# Patient Record
Sex: Male | Born: 1970 | Race: White | Hispanic: No | Marital: Married | Smoking: Former smoker
Health system: Southern US, Community
[De-identification: ages and names within clinical notes are randomized; demographics above are authoritative.]

## PROBLEM LIST (undated history)

## (undated) DIAGNOSIS — I2510 Atherosclerotic heart disease of native coronary artery without angina pectoris: Secondary | ICD-10-CM

## (undated) HISTORY — PX: CHOLECYSTECTOMY: SHX55

---

## 2016-02-07 DIAGNOSIS — Z87891 Personal history of nicotine dependence: Secondary | ICD-10-CM | POA: Insufficient documentation

## 2016-02-07 DIAGNOSIS — E669 Obesity, unspecified: Secondary | ICD-10-CM | POA: Insufficient documentation

## 2016-02-07 DIAGNOSIS — I2510 Atherosclerotic heart disease of native coronary artery without angina pectoris: Secondary | ICD-10-CM | POA: Insufficient documentation

## 2017-05-02 ENCOUNTER — Encounter: Payer: Self-pay | Admitting: Emergency Medicine

## 2017-05-02 ENCOUNTER — Ambulatory Visit
Admission: EM | Admit: 2017-05-02 | Discharge: 2017-05-02 | Disposition: A | Discharge status: Home / Self Care | Payer: BLUE CROSS/BLUE SHIELD

## 2017-05-02 DIAGNOSIS — L03011 Cellulitis of right finger: Secondary | ICD-10-CM | POA: Diagnosis not present

## 2017-05-02 DIAGNOSIS — L02511 Cutaneous abscess of right hand: Secondary | ICD-10-CM

## 2017-05-02 HISTORY — DX: Atherosclerotic heart disease of native coronary artery without angina pectoris: I25.10

## 2017-05-02 MED ORDER — MUPIROCIN 2 % EX OINT: 1.0000 "application " | TOPICAL_OINTMENT | Freq: Three times a day (TID) | CUTANEOUS | 0 refills | Status: DC

## 2017-05-02 MED ORDER — DOXYCYCLINE HYCLATE 100 MG PO CAPS: 100.0000 mg | ORAL_CAPSULE | Freq: Two times a day (BID) | ORAL | 0 refills | Status: DC

## 2017-05-02 NOTE — ED Triage Notes (Signed)
Patient in today c/o infection in his right pointer finger x 3-4 days.patient has soaked it in salt water w/o relief.

## 2017-05-02 NOTE — ED Provider Notes (Signed)
[facility]    CSN: [id number] Arrival date & time: 05/02/17  0802     History   Chief Complaint Chief Complaint  Patient presents with  . infected finger    HPI Paul Nielsen is a 46 y.o. male.   HPI  [name] who presents with a right dominant index finger paronychia. He states that he was cleaning out his gutters shortly before developing the paronychia. Since that time he has been soaking it and salt water but has not had any success in resolution. He denies any fever or chills.        Past Medical History:  Diagnosis Date  . Coronary artery disease    2014, 1 stent placed    There are no active problems to display for this patient.   Past Surgical History:  Procedure Laterality Date  . CHOLECYSTECTOMY         Home Medications    Prior to Admission medications   Medication Sig Start Date End Date Taking? Authorizing Provider  ASPIRIN 81 PO Take 1 tablet by mouth daily.   Yes [provider]  atorvastatin (LIPITOR) 40 MG tablet Take 40 mg by mouth daily.   Yes [provider]  clopidogrel (PLAVIX) 75 MG tablet Take 75 mg by mouth daily.   Yes [provider]  lisinopril (PRINIVIL,ZESTRIL) 2.5 MG tablet Take 2.5 mg by mouth daily.   Yes [provider]  doxycycline (VIBRAMYCIN) 100 MG capsule Take 1 capsule (100 mg total) by mouth 2 (two) times daily. 05/02/17   Lorin Picket, PA-C  mupirocin ointment (BACTROBAN) 2 % Apply 1 application topically 3 (three) times daily. 05/02/17   Lorin Picket, PA-C    Family History Family History  Problem Relation Age of Onset  . Cancer Mother        breast    Social History Social History  Substance Use Topics  . Smoking status: Former Smoker    Quit date: 05/02/2013  . Smokeless tobacco: Never Used  . Alcohol use Yes     Comment: socially     Allergies   Patient has no known allergies.   Review of Systems Review of Systems    Constitutional: Positive for activity change. Negative for chills, fatigue and fever.  Skin: Positive for color change and wound.  All other systems reviewed and are negative.    Physical Exam Triage Vital Signs ED Triage Vitals  Enc Vitals Group     BP 05/02/17 0819 102/74     Pulse Rate 05/02/17 0819 79     Resp 05/02/17 0819 16     Temp 05/02/17 0819 98.2 F (36.8 C)     Temp Source 05/02/17 0819 Oral     SpO2 05/02/17 0819 100 %     Weight 05/02/17 0818 230 lb (104.3 kg)     Height 05/02/17 0818 6\' 2"  (1.88 m)     Head Circumference --      Peak Flow --      Pain Score 05/02/17 0820 4     Pain Loc --      Pain Edu? --      Excl. in [place]? --    No data found.   Updated Vital Signs BP 102/74 (BP Location: Left Arm)   Pulse 79   Temp 98.2 F (36.8 C) (Oral)   Resp 16   Ht 6\' 2"  (1.88 m)   Wt 230 lb (104.3 kg)   SpO2  100%   BMI 29.53 kg/m   Visual Acuity Right Eye Distance:   Left Eye Distance:   Bilateral Distance:    Right Eye Near:   Left Eye Near:    Bilateral Near:     Physical Exam  Constitutional: He is oriented to person, place, and time. He appears well-developed and well-nourished. No distress.  HENT:  Head: Normocephalic.  Eyes: Pupils are equal, round, and reactive to light. Right eye exhibits no discharge. Left eye exhibits no discharge.  Neck: Normal range of motion.  Musculoskeletal: Normal range of motion. He exhibits edema and tenderness.  Neurological: He is alert and oriented to person, place, and time.  Skin: Skin is warm and dry. He is not diaphoretic.   Examination of he right dominant index finger shows swelling erythema and fluctuance at the base of the nail.  Psychiatric: He has a normal mood and affect. His behavior is normal. Judgment and thought content normal.  Nursing note and vitals reviewed.    UC Treatments / Results  Labs (all labs ordered are listed, but only abnormal results are displayed) Labs Reviewed - No data  to display  EKG  EKG Interpretation None       Radiology No results found.  Procedures .Marland KitchenIncision and Drainage Date/Time: 05/02/2017 9:16 AM Performed by: Lorin Picket Authorized by: Lorin Picket   Consent:    Consent obtained:  Verbal   Consent given by:  Patient   Risks discussed:  Incomplete drainage, pain and infection   Alternatives discussed:  Alternative treatment Location:    Type:  Abscess   Size:  5x3 mm   Location:  Upper extremity   Upper extremity location:  Finger   Finger location:  R index finger Pre-procedure details:    Skin preparation:  Chloraprep Anesthesia (see MAR for exact dosages):    Anesthesia method:  Local infiltration   Local anesthetic:  Lidocaine 1% w/o epi Procedure type:    Complexity:  Simple Procedure details:    Needle aspiration: yes     Needle size:  18 G   Incision types:  Stab incision   Incision depth:  Subcutaneous   Wound management:  Probed and deloculated   Drainage:  Bloody and purulent   Drainage amount:  Scant   Wound treatment:  Wound left open   Packing materials:  None Post-procedure details:    Patient tolerance of procedure:  Tolerated well, no immediate complications Comments:     Patient was instructed in warm compresses to apply 3-4 times daily for 10 minutes at a time. He will dry the area and apply Bactroban ointment. He was also placed on doxycycline twice a day for 5 days. He is leaving Saturday for [place]. I have advised him that if it is worsening or not improving should be seen in emergency room or urgent care in [place]. Removable fingertip splint was applied for protection only.   (including critical care time)  Medications Ordered in UC Medications - No data to display   Initial Impression / Assessment and Plan / UC Course  I have reviewed the triage vital signs and the nursing notes.  Pertinent labs & imaging results that were available during my care of the patient were reviewed by  me and considered in my medical decision making (see chart for details).     Plan: 1. Test/x-ray results and diagnosis reviewed with patient 2. rx as per orders; risks, benefits, potential side effects reviewed with patient 3. Recommend  supportive treatment with washing and applying warm compresses 3 times daily. Refer to procedural comments for further details. 4. F/u prn if symptoms worsen or don't improve   Final Clinical Impressions(s) / UC Diagnoses   Final diagnoses:  Paronychia of right index finger    New Prescriptions Discharge Medication List as of 05/02/2017  9:11 AM    START taking these medications   Details  doxycycline (VIBRAMYCIN) 100 MG capsule Take 1 capsule (100 mg total) by mouth 2 (two) times daily., Starting Thu 05/02/2017, Normal    mupirocin ointment (BACTROBAN) 2 % Apply 1 application topically 3 (three) times daily., Starting Thu 05/02/2017, Normal         Controlled Substance Prescriptions [place] Controlled Substance Registry consulted? Not Applicable   Lorin Picket, PA-C 05/02/17 1188

## 2017-05-02 NOTE — Discharge Instructions (Signed)
Apply warm compresses 3-4 times daily for 10 minutes each time as we discussed ;afterwards dry thoroughly and apply Bactroban ointment. Use the finger splint as necessary to prevent irritation

## 2020-01-29 ENCOUNTER — Encounter: Payer: Self-pay | Admitting: Emergency Medicine

## 2020-01-29 ENCOUNTER — Other Ambulatory Visit: Payer: Self-pay

## 2020-01-29 ENCOUNTER — Ambulatory Visit
Admission: EM | Admit: 2020-01-29 | Discharge: 2020-01-29 | Disposition: A | Discharge status: Home / Self Care | Payer: BC Managed Care – PPO | Attending: Internal Medicine | Admitting: Internal Medicine

## 2020-01-29 DIAGNOSIS — L255 Unspecified contact dermatitis due to plants, except food: Secondary | ICD-10-CM

## 2020-01-29 MED ORDER — HYDROXYZINE HCL 25 MG PO TABS: 25.0000 mg | ORAL_TABLET | Freq: Three times a day (TID) | ORAL | 0 refills | Status: DC | PRN

## 2020-01-29 MED ORDER — METHYLPREDNISOLONE SODIUM SUCC 125 MG IJ SOLR
60.0000 mg | Freq: Once | INTRAMUSCULAR | Status: AC
  Administered 2020-01-29: 60 mg via INTRAMUSCULAR

## 2020-01-29 MED ORDER — PREDNISONE 10 MG (21) PO TBPK: ORAL_TABLET | Freq: Every day | ORAL | 0 refills | Status: DC

## 2020-01-29 NOTE — ED Triage Notes (Signed)
Patient c/o itchy rash on his face, arms and legs for the past 3 days.

## 2020-01-29 NOTE — Discharge Instructions (Addendum)
Return to urgent care if symptoms worsen Continue to apply over-the-counter remedies to the rash

## 2020-01-29 NOTE — ED Provider Notes (Signed)
[facility]    CSN: [id number] Arrival date & time: 01/29/20  4193      History   Chief Complaint Chief Complaint  Patient presents with  . Rash    HPI Paul Nielsen is a 49 y.o. male patient comes to the urgent care with complaints of generalized pruritic rash over the face, arms and legs over the past 3 days.  Patient got into some sumac few days ago and then subsequently he noticed the rash.  Patient denies any fever or chills.  He has applied over-the-counter medications with no improvement.  No irritation in his eyes.  No light sensitivity.  No nausea or vomiting.   HPI  Past Medical History:  Diagnosis Date  . Coronary artery disease    2014, 1 stent placed    There are no problems to display for this patient.   Past Surgical History:  Procedure Laterality Date  . CHOLECYSTECTOMY         Home Medications    Prior to Admission medications   Medication Sig Start Date End Date Taking? Authorizing Provider  ASPIRIN 81 PO Take 1 tablet by mouth daily.   Yes [provider]  atorvastatin (LIPITOR) 40 MG tablet Take 40 mg by mouth daily.   Yes [provider]  hydrOXYzine (ATARAX/VISTARIL) 25 MG tablet Take 1 tablet (25 mg total) by mouth every 8 (eight) hours as needed for itching. 01/29/20   Rollen Selders, Myrene Galas, MD  predniSONE (STERAPRED UNI-PAK 21 TAB) 10 MG (21) TBPK tablet Take by mouth daily. Take 6 tabs by mouth daily  for 2 days, then 5 tabs for 2 days, then 4 tabs for 2 days, then 3 tabs for 2 days, 2 tabs for 2 days, then 1 tab by mouth daily for 2 days 01/29/20   Chase Picket, MD  lisinopril (PRINIVIL,ZESTRIL) 2.5 MG tablet Take 2.5 mg by mouth daily.  01/29/20  [provider]    Family History Family History  Problem Relation Age of Onset  . Cancer Mother        breast    Social History Social History   Tobacco Use  . Smoking status: Former Smoker    Quit date: 05/02/2013    Years since quitting: 6.7  .  Smokeless tobacco: Never Used  Vaping Use  . Vaping Use: Never used  Substance Use Topics  . Alcohol use: Yes    Comment: socially  . Drug use: No     Allergies   Patient has no known allergies.   Review of Systems Review of Systems  Respiratory: Negative.   Cardiovascular: Negative.   Gastrointestinal: Negative.   Musculoskeletal: Negative for arthralgias and myalgias.  Skin: Positive for rash. Negative for color change and wound.  Neurological: Negative.      Physical Exam Triage Vital Signs ED Triage Vitals  Enc Vitals Group     BP 01/29/20 0957 118/78     Pulse Rate 01/29/20 0957 69     Resp 01/29/20 0957 16     Temp 01/29/20 0957 98 F (36.7 C)     Temp Source 01/29/20 0957 Oral     SpO2 01/29/20 0957 100 %     Weight 01/29/20 0955 (!) 245 lb (111.1 kg)     Height 01/29/20 0955 6\' 2"  (1.88 m)     Head Circumference --      Peak Flow --      Pain Score 01/29/20 0955 0  Pain Loc --      Pain Edu? --      Excl. in [place]? --    No data found.  Updated Vital Signs BP 118/78 (BP Location: Right Arm)   Pulse 69   Temp 98 F (36.7 C) (Oral)   Resp 16   Ht 6\' 2"  (1.88 m)   Wt (!) 111.1 kg   SpO2 100%   BMI 31.46 kg/m   Visual Acuity Right Eye Distance:   Left Eye Distance:   Bilateral Distance:    Right Eye Near:   Left Eye Near:    Bilateral Near:     Physical Exam Vitals and nursing note reviewed.  Constitutional:      General: He is not in acute distress.    Appearance: He is not ill-appearing.  Cardiovascular:     Rate and Rhythm: Normal rate and regular rhythm.  Skin:    General: Skin is warm.     Findings: Rash present. No bruising.     Comments: Generalized papular rash over the face upper extremities and legs.  No ulcerations.  No vesicles or blistering.  Rash over the face is more pronounced compared to the extremities.  Neurological:     Mental Status: He is alert.      UC Treatments / Results  Labs (all labs ordered are  listed, but only abnormal results are displayed) Labs Reviewed - No data to display  EKG   Radiology No results found.  Procedures Procedures (including critical care time)  Medications Ordered in UC Medications  methylPREDNISolone sodium succinate (SOLU-MEDROL) 125 mg/2 mL injection 60 mg (has no administration in time range)    Initial Impression / Assessment and Plan / UC Course  I have reviewed the triage vital signs and the nursing notes.  Pertinent labs & imaging results that were available during my care of the patient were reviewed by me and considered in my medical decision making (see chart for details).     1.  Rous dermatitis: Methylprednisolone 60 mg IM x1 Tapering dose of prednisone Hydralazine as needed for itching Return precautions given If patient symptoms worsens he is advised to return to urgent care to be reevaluated. Final Clinical Impressions(s) / UC Diagnoses   Final diagnoses:  Rhus dermatitis     Discharge Instructions     Return to urgent care if symptoms worsen Continue to apply over-the-counter remedies to the rash   ED Prescriptions    Medication Sig Dispense Auth. Provider   predniSONE (STERAPRED UNI-PAK 21 TAB) 10 MG (21) TBPK tablet Take by mouth daily. Take 6 tabs by mouth daily  for 2 days, then 5 tabs for 2 days, then 4 tabs for 2 days, then 3 tabs for 2 days, 2 tabs for 2 days, then 1 tab by mouth daily for 2 days 42 tablet Katianne Barre, Myrene Galas, MD   hydrOXYzine (ATARAX/VISTARIL) 25 MG tablet Take 1 tablet (25 mg total) by mouth every 8 (eight) hours as needed for itching. 30 tablet Ashleigh Luckow, Myrene Galas, MD     PDMP not reviewed this encounter.   Chase Picket, MD 01/29/20 [id number]

## 2020-10-25 DIAGNOSIS — I252 Old myocardial infarction: Secondary | ICD-10-CM | POA: Diagnosis not present

## 2020-10-25 DIAGNOSIS — I25119 Atherosclerotic heart disease of native coronary artery with unspecified angina pectoris: Secondary | ICD-10-CM | POA: Diagnosis not present

## 2020-10-25 DIAGNOSIS — F419 Anxiety disorder, unspecified: Secondary | ICD-10-CM | POA: Diagnosis not present

## 2020-10-25 DIAGNOSIS — Z79899 Other long term (current) drug therapy: Secondary | ICD-10-CM | POA: Diagnosis not present

## 2020-10-25 DIAGNOSIS — I213 ST elevation (STEMI) myocardial infarction of unspecified site: Secondary | ICD-10-CM | POA: Diagnosis not present

## 2020-10-25 DIAGNOSIS — Z87891 Personal history of nicotine dependence: Secondary | ICD-10-CM | POA: Diagnosis not present

## 2020-11-07 DIAGNOSIS — Z20822 Contact with and (suspected) exposure to covid-19: Secondary | ICD-10-CM | POA: Diagnosis not present

## 2020-11-14 ENCOUNTER — Telehealth: Payer: BC Managed Care – PPO | Admitting: Emergency Medicine

## 2020-11-14 DIAGNOSIS — U071 COVID-19: Secondary | ICD-10-CM

## 2020-11-14 NOTE — Progress Notes (Signed)
  Based on what you have shared with me, you need to seek an "in-person" evaluation for respiratory symptoms related to Covid 19.   Please call for an appointment:   Respiratory Clinic at [facility] [address] [place], [place]. Monday, Wednesday, Friday 5:30PM to 7:30PM  If you have severe symptoms of any kind, please seek medical care at an emergency room. Our Emergency Departments are best equipped to handle patients with severe symptoms.    . [facility] Emergency Department [place], [place], [place] [address] [phone]  . [facility] Emergency Department [place], [place], [place] [address] [phone]  . [facility] Emergency Department [place], [place], [place] [address] [phone]  . [facility] Emergency Department [address] [place], [place], [place] [address] [phone]  . [facility] Emergency Department [place], [place], [place] [address] [phone]   If you are having a true medical emergency please call 911.  NOTE: If you entered your credit card information for this eVisit, you will not be charged. You may see a "hold" on your card for the $35 but that hold will drop off and you will not have a charge processed.   Your e-visit answers were reviewed by a board certified advanced clinical practitioner to complete your personal care plan.  Thank you for using e-Visits     Approximately 5 minutes was spent documenting and reviewing patient's chart.

## 2020-11-17 ENCOUNTER — Encounter: Payer: Self-pay | Admitting: Nurse Practitioner

## 2020-11-17 NOTE — Progress Notes (Signed)
error 

## 2020-11-21 ENCOUNTER — Encounter: Payer: Self-pay | Admitting: Nurse Practitioner

## 2020-11-21 ENCOUNTER — Telehealth (INDEPENDENT_AMBULATORY_CARE_PROVIDER_SITE_OTHER): Payer: Self-pay | Admitting: Nurse Practitioner

## 2020-11-21 DIAGNOSIS — Z8616 Personal history of COVID-19: Secondary | ICD-10-CM | POA: Insufficient documentation

## 2020-11-21 DIAGNOSIS — J0100 Acute maxillary sinusitis, unspecified: Secondary | ICD-10-CM

## 2020-11-21 MED ORDER — AZITHROMYCIN 250 MG PO TABS: ORAL_TABLET | ORAL | 0 refills | Status: AC

## 2020-11-21 MED ORDER — PREDNISONE 20 MG PO TABS: 20.0000 mg | ORAL_TABLET | Freq: Every day | ORAL | 0 refills | Status: AC

## 2020-11-21 NOTE — Patient Instructions (Addendum)
History of Covid 19 Sinusits:   Stay well hydrated  Stay active  Deep breathing exercises  May take tylenol or fever or pain  May take mucinex twice daily  Will order azithromycin  Will order prednisone  May start zyrtec     Follow up:  Follow up if needed

## 2020-11-21 NOTE — Progress Notes (Signed)
Virtual Visit via Telephone Note  I connected with Paul Nielsen on 11/21/20 at  8:30 AM EDT by telephone and verified that I am speaking with the correct person using two identifiers.  Location: Patient: home Provider: office   I discussed the limitations, risks, security and privacy concerns of performing an evaluation and management service by telephone and the availability of in person appointments. I also discussed with the patient that there may be a patient responsible charge related to this service. The patient expressed understanding and agreed to proceed.   History of Present Illness:  Patient presents today for post-COVID care clinic visit through televisit.  Patient has a positive for COVID 2 weeks ago.  He states that he is currently in [place].  He is having lingering symptoms of head congestion and slight chest congestion.  He has been taking Mucinex with minimal relief noted.  He does have a cough that is productive at times.  He denies any recent fever. Denies f/c/s, n/v/d, hemoptysis, PND, chest pain or edema.    Observations/Objective:  Vitals with BMI 01/29/2020 05/02/2017  Height 6\' 2"  6\' 2"   Weight 245 lbs 230 lbs  BMI 09.32 35.57  Systolic 322 025  Diastolic 78 74  Pulse 69 79     Assessment and Plan:  History of Covid 19 Sinusits:   Stay well hydrated  Stay active  Deep breathing exercises  May take tylenol or fever or pain  May take mucinex twice daily  Will order azithromycin  Will order prednisone  May start zyrtec     Follow up:  Follow up if needed      I discussed the assessment and treatment plan with the patient. The patient was provided an opportunity to ask questions and all were answered. The patient agreed with the plan and demonstrated an understanding of the instructions.   The patient was advised to call back or seek an in-person evaluation if the symptoms worsen or if the condition fails to improve as  anticipated.  I provided 23 minutes of non-face-to-face time during this encounter.   Fenton Foy, NP

## 2020-12-02 DIAGNOSIS — L0233 Carbuncle of buttock: Secondary | ICD-10-CM | POA: Diagnosis not present

## 2020-12-02 DIAGNOSIS — R6889 Other general symptoms and signs: Secondary | ICD-10-CM | POA: Diagnosis not present

## 2020-12-02 DIAGNOSIS — J392 Other diseases of pharynx: Secondary | ICD-10-CM | POA: Diagnosis not present

## 2021-03-22 DIAGNOSIS — F4323 Adjustment disorder with mixed anxiety and depressed mood: Secondary | ICD-10-CM | POA: Diagnosis not present

## 2021-03-22 DIAGNOSIS — Z630 Problems in relationship with spouse or partner: Secondary | ICD-10-CM | POA: Diagnosis not present

## 2021-04-14 ENCOUNTER — Other Ambulatory Visit: Payer: Self-pay

## 2021-04-14 ENCOUNTER — Ambulatory Visit (INDEPENDENT_AMBULATORY_CARE_PROVIDER_SITE_OTHER): Payer: BC Managed Care – PPO

## 2021-04-14 ENCOUNTER — Ambulatory Visit
Admission: RE | Admit: 2021-04-14 | Discharge: 2021-04-14 | Disposition: A | Discharge status: Home / Self Care | Payer: BC Managed Care – PPO | Source: Ambulatory Visit | Attending: Physician Assistant | Admitting: Physician Assistant

## 2021-04-14 VITALS — BP 138/79 | HR 88 | Temp 98.1°F | Resp 16

## 2021-04-14 DIAGNOSIS — R198 Other specified symptoms and signs involving the digestive system and abdomen: Secondary | ICD-10-CM

## 2021-04-14 DIAGNOSIS — R0602 Shortness of breath: Secondary | ICD-10-CM | POA: Diagnosis not present

## 2021-04-14 DIAGNOSIS — R0789 Other chest pain: Secondary | ICD-10-CM | POA: Diagnosis not present

## 2021-04-14 NOTE — Discharge Instructions (Signed)
-  Chest x-ray does not show any evidence of a hiatal hernia but it is not completely ruled out and I do have high concern for that. -Start taking Prilosec over-the-counter and increase fluid intake. -I have placed a referral to GI for you.  At this point you should talk to a specialist.  They may perform an endoscopy or an ultrasound to be able to diagnose you. -If you have any abdominal pain, fever, weakness, vomiting, black or bloody stools he should go to the emergency department.

## 2021-04-14 NOTE — ED Provider Notes (Signed)
[facility]    CSN: [id number] Arrival date & time: 04/14/21  1735      History   Chief Complaint Chief Complaint  Patient presents with   Appointment    HPI Paul Nielsen is a 50 y.o. male presenting for approximately 4-day history of upper abdominal pressure and discomfort after eating. He also says that whenever he eats he feels that it is difficult to take a deep breath.  Admits to occasional shortness of breath also.  Pain improves when he lies flat and he says he does not have any symptoms when he is not eating.  He denies abdominal pain.  Normal appetite.  Denies constipation, diarrhea and does not report any dark or bloody stools.  Patient reports that the symptoms have occurred in the past a couple months ago when he had COVID-19.  Patient reports recently coming back from [place] and says symptoms started in [place].  He reports not eating well when he was down there.  He also admits to increased stress in his life this year and says he has gained about 25 pounds.  Patient has taken over-the-counter Gas-X medication and a laxative when he was in [place] and said that seemed to help symptoms.  He denies any fevers.  No reported history of GERD and he denies any pain in his chest.  Has never been seen for this issue before.  Surgical history significant for cholecystectomy.  No other complaints.  HPI  Past Medical History:  Diagnosis Date   Coronary artery disease    2014, 1 stent placed    Patient Active Problem List   Diagnosis Date Noted   History of COVID-19 11/21/2020   Acute non-recurrent maxillary sinusitis 11/21/2020    Past Surgical History:  Procedure Laterality Date   CHOLECYSTECTOMY         Home Medications    Prior to Admission medications   Medication Sig Start Date End Date Taking? Authorizing Provider  ASPIRIN 81 PO Take 1 tablet by mouth daily.   Yes [provider]  atorvastatin (LIPITOR) 40 MG tablet Take 40 mg by mouth  daily.   Yes [provider]  hydrOXYzine (ATARAX/VISTARIL) 25 MG tablet Take 1 tablet (25 mg total) by mouth every 8 (eight) hours as needed for itching. 01/29/20   Chase Picket, MD  lisinopril (PRINIVIL,ZESTRIL) 2.5 MG tablet Take 2.5 mg by mouth daily.  01/29/20  [provider]    Family History Family History  Problem Relation Age of Onset   Cancer Mother        breast    Social History Social History   Tobacco Use   Smoking status: Former    Types: Cigarettes    Quit date: 05/02/2013    Years since quitting: 7.9   Smokeless tobacco: Never  Vaping Use   Vaping Use: Never used  Substance Use Topics   Alcohol use: Yes    Comment: socially   Drug use: No     Allergies   Patient has no known allergies.   Review of Systems Review of Systems  Constitutional:  Negative for appetite change, fatigue and fever.  HENT:  Negative for congestion.   Respiratory:  Positive for shortness of breath. Negative for cough.   Cardiovascular:  Negative for chest pain.  Gastrointestinal:  Negative for abdominal pain, blood in stool, constipation, diarrhea, nausea and vomiting.       Upper abdominal pressure  Genitourinary:  Negative for dysuria, frequency and testicular  pain.  Musculoskeletal:  Negative for back pain.  Neurological:  Negative for weakness.    Physical Exam Triage Vital Signs ED Triage Vitals  Enc Vitals Group     BP      Pulse      Resp      Temp      Temp src      SpO2      Weight      Height      Head Circumference      Peak Flow      Pain Score      Pain Loc      Pain Edu?      Excl. in [place]?    No data found.  Updated Vital Signs BP 138/79   Pulse 88   Temp 98.1 F (36.7 C) (Oral)   Resp 16   SpO2 96%      Physical Exam Vitals and nursing note reviewed.  Constitutional:      General: He is not in acute distress.    Appearance: Normal appearance. He is well-developed. He is not ill-appearing.  HENT:     Head:  Normocephalic and atraumatic.  Eyes:     General: No scleral icterus.    Conjunctiva/sclera: Conjunctivae normal.  Cardiovascular:     Rate and Rhythm: Normal rate and regular rhythm.     Heart sounds: Normal heart sounds.  Pulmonary:     Effort: Pulmonary effort is normal. No respiratory distress.     Breath sounds: Normal breath sounds.  Abdominal:     Palpations: Abdomen is soft.     Tenderness: There is no abdominal tenderness.  Musculoskeletal:     Cervical back: Neck supple.  Skin:    General: Skin is warm and dry.  Neurological:     General: No focal deficit present.     Mental Status: He is alert. Mental status is at baseline.     Motor: No weakness.     Coordination: Coordination normal.     Gait: Gait normal.  Psychiatric:        Mood and Affect: Mood normal.        Behavior: Behavior normal.        Thought Content: Thought content normal.     UC Treatments / Results  Labs (all labs ordered are listed, but only abnormal results are displayed) Labs Reviewed - No data to display  EKG   Radiology DG Chest 2 View  Result Date: 04/14/2021 CLINICAL DATA:  Recent COVID, pain shortness of breath, chest and abdominal pressure after eating. EXAM: CHEST - 2 VIEW COMPARISON:  None. FINDINGS: The heart size and mediastinal contours are within normal limits. No focal consolidation. No pleural effusion. No pneumothorax. Thoracic spondylosis. Cholecystectomy clips. IMPRESSION: No active cardiopulmonary disease. Electronically Signed   By: Dahlia Bailiff M.D.   On: 04/14/2021 19:11    Procedures Procedures (including critical care time)  Medications Ordered in UC Medications - No data to display  Initial Impression / Assessment and Plan / UC Course  I have reviewed the triage vital signs and the nursing notes.  Pertinent labs & imaging results that were available during my care of the patient were reviewed by me and considered in my medical decision making (see chart for  details).  50 year old male presenting for upper abdominal pressure when eating and occasional shortness of breath especially when eating.  Symptoms improved and he lies flat.  Does report 25 pound weight gain this year  due to stress.  Exam is within normal limits today and so are vital signs.  Chest x-ray obtained to assess for possible hiatal hernia.  Chest x-ray normal.  Advised patient this cannot 100% rule out a hiatal hernia and I do advise he has further work-up for this so I placed a referral to GI.  In the meantime, advised him to start a PPI and eat smaller meals and do not lay down after eating for couple of hours.  Advised to increase rest and fluids and consider taking MiraLAX if he feels constipated and to make sure he is getting plenty of exercise and dietary fiber.  Weight loss encouraged.  Advise going to ED for fever, abdominal pain, lethargy, etc.  Final Clinical Impressions(s) / UC Diagnoses   Final diagnoses:  High intra-abdominal pressure     Discharge Instructions      -Chest x-ray does not show any evidence of a hiatal hernia but it is not completely ruled out and I do have high concern for that. -Start taking Prilosec over-the-counter and increase fluid intake. -I have placed a referral to GI for you.  At this point you should talk to a specialist.  They may perform an endoscopy or an ultrasound to be able to diagnose you. -If you have any abdominal pain, fever, weakness, vomiting, black or bloody stools he should go to the emergency department.     ED Prescriptions   None    PDMP not reviewed this encounter.   Danton Clap, PA-C 04/14/21 1931

## 2021-04-14 NOTE — ED Triage Notes (Signed)
PT reports he had covid recently and he feels a lot of stomach pressure / shortness of breath.

## 2021-04-18 DIAGNOSIS — R0602 Shortness of breath: Secondary | ICD-10-CM | POA: Diagnosis not present

## 2021-04-18 DIAGNOSIS — R10816 Epigastric abdominal tenderness: Secondary | ICD-10-CM | POA: Diagnosis not present

## 2021-04-18 DIAGNOSIS — Z87891 Personal history of nicotine dependence: Secondary | ICD-10-CM | POA: Diagnosis not present

## 2021-04-18 DIAGNOSIS — R1013 Epigastric pain: Secondary | ICD-10-CM | POA: Diagnosis not present

## 2021-04-18 DIAGNOSIS — I2510 Atherosclerotic heart disease of native coronary artery without angina pectoris: Secondary | ICD-10-CM | POA: Diagnosis not present

## 2021-04-20 DIAGNOSIS — R1013 Epigastric pain: Secondary | ICD-10-CM | POA: Diagnosis not present

## 2021-04-27 DIAGNOSIS — F4323 Adjustment disorder with mixed anxiety and depressed mood: Secondary | ICD-10-CM | POA: Diagnosis not present

## 2021-04-27 DIAGNOSIS — Z630 Problems in relationship with spouse or partner: Secondary | ICD-10-CM | POA: Diagnosis not present

## 2021-05-08 DIAGNOSIS — Z630 Problems in relationship with spouse or partner: Secondary | ICD-10-CM | POA: Diagnosis not present

## 2021-05-08 DIAGNOSIS — F4323 Adjustment disorder with mixed anxiety and depressed mood: Secondary | ICD-10-CM | POA: Diagnosis not present

## 2021-05-18 ENCOUNTER — Other Ambulatory Visit: Payer: Self-pay

## 2021-05-18 ENCOUNTER — Encounter: Payer: Self-pay | Admitting: Gastroenterology

## 2021-05-18 ENCOUNTER — Ambulatory Visit: Payer: BC Managed Care – PPO | Admitting: Gastroenterology

## 2021-05-18 VITALS — BP 125/82 | HR 81 | Temp 99.1°F | Ht 70.0 in | Wt 265.0 lb

## 2021-05-18 DIAGNOSIS — R1010 Upper abdominal pain, unspecified: Secondary | ICD-10-CM | POA: Diagnosis not present

## 2021-05-18 NOTE — Progress Notes (Signed)
Paul Darby, MD [address]  [place]  [place], [place] [address]  Main: [phone]  Fax: [phone]    Gastroenterology Consultation  Referring Provider:     Gretta Cool Primary Care Physician:  Patient, No Pcp Per (Inactive) Primary Gastroenterologist:  Dr. Cephas Nielsen Reason for Consultation:     Upper abdominal discomfort        HPI:   Paul Nielsen is a 50 y.o. male referred by Dr. Patient, No Pcp Per (Inactive)  for consultation & management of upper abdominal discomfort.  Patient went to [facility] on 10/18 secondary to 1 week history of epigastric discomfort that started after 5 days of arrival from [place].  Patient went on vacation to [place].  He describes the discomfort as pressure all across his upper abdomen, he feels short of breath because of upper abdominal pressure and sometimes has to catch his breath.  Patient denies chest pain associated with this discomfort.  Patient denies nausea, vomiting.  Patient reports that he is not able to finish his meal because of the discomfort.  He denies abdominal bloating.  He denies melena, change in bowel habits.  He reports having regular bowel movements.  He is a Biomedical scientist and experiments different cuisines.  He denies any weight loss.  He denies heartburn, difficulty swallowing.  He underwent chest x-ray and twelve-lead EKG which were normal.  Labs were unrevealing including CBC, CMP, lipase, troponins.  Patient is expecting to find answers from today's visit.  He is frustrated that he already spent $2000 despite having insurance.  He had history of MI s/p PCI with stent placement in 2014.  Ex-smoker.  Does not drink alcohol regularly  NSAIDs: None  Antiplts/Anticoagulants/Anti thrombotics: None  GI Procedures: None Cologuard negative Patient denies family history of GI malignancy Cholecystectomy at least 4 to 5 years ago  Past Medical History:  Diagnosis Date   Coronary artery disease    2014, 1 stent  placed    Past Surgical History:  Procedure Laterality Date   CHOLECYSTECTOMY      Current Outpatient Medications:    ASPIRIN 81 PO, Take 1 tablet by mouth daily., Disp: , Rfl:    atorvastatin (LIPITOR) 40 MG tablet, Take 40 mg by mouth daily., Disp: , Rfl:    hydrOXYzine (ATARAX/VISTARIL) 25 MG tablet, Take 1 tablet (25 mg total) by mouth every 8 (eight) hours as needed for itching., Disp: 30 tablet, Rfl: 0  Family History  Problem Relation Age of Onset   Cancer Mother        breast     Social History   Tobacco Use   Smoking status: Former    Types: Cigarettes    Quit date: 05/02/2013    Years since quitting: 8.0   Smokeless tobacco: Never  Vaping Use   Vaping Use: Never used  Substance Use Topics   Alcohol use: Yes    Comment: socially   Drug use: No    Allergies as of 05/18/2021   (No Known Allergies)    Review of Systems:    All systems reviewed and negative except where noted in HPI.   Physical Exam:  BP 125/82 (BP Location: Right Arm, Patient Position: Sitting, Cuff Size: Large)   Pulse 81   Temp 99.1 F (37.3 C) (Temporal)   Ht 5\' 10"  (1.778 m)   Wt 265 lb (120.2 kg)   BMI 38.02 kg/m  No LMP for male patient.  General:  Alert,  Well-developed, well-nourished, pleasant and cooperative in NAD Head:  Normocephalic and atraumatic. Eyes:  Sclera clear, no icterus.   Conjunctiva pink. Ears:  Normal auditory acuity. Nose:  No deformity, discharge, or lesions. Mouth:  No deformity or lesions,oropharynx pink & moist. Neck:  Supple; no masses or thyromegaly. Lungs:  Respirations even and unlabored.  Clear throughout to auscultation.   No wheezes, crackles, or rhonchi. No acute distress. Heart:  Regular rate and rhythm; no murmurs, clicks, rubs, or gallops. Abdomen:  Normal bowel sounds. Soft, non-tender and non-distended without masses, hepatosplenomegaly or hernias noted.  No guarding or rebound tenderness.   Rectal: Not performed Msk:  Symmetrical  without gross deformities. Good, equal movement & strength bilaterally. Pulses:  Normal pulses noted. Extremities:  No clubbing or edema.  No cyanosis. Neurologic:  Alert and oriented x3;  grossly normal neurologically. Skin:  Intact without significant lesions or rashes. No jaundice. Psych:  Alert and cooperative. Normal mood and affect.  Imaging Studies: None  Assessment and Plan:   Paul Nielsen is a 50 y.o. Caucasian male with BMI 38, coronary artery disease, MI with stent placement in 2014 is seen in consultation for more than 1 month history of upper abdominal discomfort, feeling of pressure associated with shortness of breath/difficulty breathing and ?  Early satiety  Recommend H. pylori breath test if negative, recommend CT angio chest PE protocol as well as CT abdomen and pelvis with contrast If imaging is unrevealing, recommend upper endoscopy and colonoscopy for further evaluation   Follow up after the above work-up   Paul Darby, MD

## 2021-05-19 ENCOUNTER — Encounter: Payer: Self-pay | Admitting: Gastroenterology

## 2021-05-19 LAB — H. PYLORI BREATH TEST: H pylori Breath Test: NEGATIVE

## 2021-05-22 ENCOUNTER — Other Ambulatory Visit: Payer: Self-pay

## 2021-05-22 DIAGNOSIS — R1010 Upper abdominal pain, unspecified: Secondary | ICD-10-CM

## 2021-05-22 NOTE — Progress Notes (Signed)
Set up ct scan of abdomin and pelvis with contrast  urgently for dec 5 at 3:15 with a 3:30 appointment npo 4 hours prior to scan and  he will need to go by and pick up contrast    And scheduled ct antigo chest pe protocol for same day right after each other  npo by mouth 4 hours prior to firtst ct scan and called patient he understands and is ok with time and day and prep and  picking up contrast

## 2021-06-05 ENCOUNTER — Ambulatory Visit: Payer: BC Managed Care – PPO

## 2021-06-06 ENCOUNTER — Telehealth: Payer: Self-pay

## 2021-06-06 NOTE — Telephone Encounter (Signed)
Patient switching care his insurance called [place] to let us know  they left message on answering machine

## 2021-06-07 ENCOUNTER — Telehealth: Payer: Self-pay

## 2021-06-07 NOTE — Telephone Encounter (Signed)
Inbound call from [employer] stating that pt would like his CT done at [facility] for a lower cost. Optician, dispensing information [phone] Option 2 then 3. Ref# [id number]. [facility] fax # [phone]. Thank you.

## 2021-06-07 NOTE — Telephone Encounter (Signed)
Called patient let him know we faxed information to [facility] for him he said he had them put a rush on when its finished to get dr Marius Ditch the results

## 2021-06-08 ENCOUNTER — Telehealth: Payer: Self-pay | Admitting: Gastroenterology

## 2021-06-08 ENCOUNTER — Telehealth: Payer: Self-pay

## 2021-06-08 NOTE — Telephone Encounter (Signed)
Inbound call from [facility] needing a doctor's signature. Pt has appt tomorrow. Best contact is [place] and phone number is [phone]

## 2021-06-08 NOTE — Telephone Encounter (Signed)
Resent fax with dr signature to [facility] radi[facility] as requested

## 2021-06-09 DIAGNOSIS — R0602 Shortness of breath: Secondary | ICD-10-CM | POA: Diagnosis not present

## 2021-06-09 DIAGNOSIS — R1010 Upper abdominal pain, unspecified: Secondary | ICD-10-CM | POA: Diagnosis not present

## 2021-06-12 ENCOUNTER — Encounter: Payer: Self-pay | Admitting: Gastroenterology

## 2021-06-13 ENCOUNTER — Encounter: Payer: Self-pay | Admitting: Gastroenterology

## 2021-06-14 ENCOUNTER — Other Ambulatory Visit: Payer: Self-pay | Admitting: Gastroenterology

## 2021-06-14 ENCOUNTER — Other Ambulatory Visit: Payer: Self-pay

## 2021-06-14 ENCOUNTER — Telehealth: Payer: Self-pay

## 2021-06-14 DIAGNOSIS — Z630 Problems in relationship with spouse or partner: Secondary | ICD-10-CM | POA: Diagnosis not present

## 2021-06-14 DIAGNOSIS — Z1211 Encounter for screening for malignant neoplasm of colon: Secondary | ICD-10-CM

## 2021-06-14 DIAGNOSIS — F4323 Adjustment disorder with mixed anxiety and depressed mood: Secondary | ICD-10-CM | POA: Diagnosis not present

## 2021-06-14 DIAGNOSIS — R1013 Epigastric pain: Secondary | ICD-10-CM

## 2021-06-14 DIAGNOSIS — G8929 Other chronic pain: Secondary | ICD-10-CM

## 2021-06-14 MED ORDER — SUTAB 1479-225-188 MG PO TABS: 12.0000 | ORAL_TABLET | Freq: Once | ORAL | 0 refills | Status: DC

## 2021-06-14 NOTE — Telephone Encounter (Signed)
Called patient he agreed to do colonoscopy and egd so I set them both up and sent in prep medicine sent refferal

## 2021-06-15 ENCOUNTER — Other Ambulatory Visit: Payer: Self-pay

## 2021-06-15 ENCOUNTER — Telehealth: Payer: Self-pay

## 2021-06-15 MED ORDER — PEG 3350-KCL-NA BICARB-NACL 420 G PO SOLR: 4000.0000 mL | Freq: Once | ORAL | 0 refills | Status: AC

## 2021-06-15 NOTE — Telephone Encounter (Signed)
Sent in alternative prep pharmacy rejected the sutab

## 2021-07-27 ENCOUNTER — Telehealth: Payer: Self-pay

## 2021-07-27 ENCOUNTER — Encounter: Payer: Self-pay | Admitting: Gastroenterology

## 2021-07-27 MED ORDER — PEG 3350-KCL-NA BICARB-NACL 420 G PO SOLR: 4000.0000 mL | Freq: Once | ORAL | 0 refills | Status: AC

## 2021-07-27 NOTE — Telephone Encounter (Signed)
Patient called in requesting paperwork and new prescription to be sent in    done

## 2021-07-27 NOTE — Telephone Encounter (Signed)
Error ment to open phone note

## 2021-07-31 ENCOUNTER — Encounter
Admission: RE | Disposition: A | Discharge status: Home / Self Care | Payer: Self-pay | Source: Home / Self Care | Attending: Gastroenterology

## 2021-07-31 ENCOUNTER — Ambulatory Visit
Admission: RE | Admit: 2021-07-31 | Discharge: 2021-07-31 | Disposition: A | Discharge status: Home / Self Care | Payer: BC Managed Care – PPO | Attending: Gastroenterology | Admitting: Gastroenterology

## 2021-07-31 ENCOUNTER — Ambulatory Visit: Payer: BC Managed Care – PPO | Admitting: Anesthesiology

## 2021-07-31 ENCOUNTER — Encounter: Payer: Self-pay | Admitting: Gastroenterology

## 2021-07-31 DIAGNOSIS — G8929 Other chronic pain: Secondary | ICD-10-CM | POA: Diagnosis not present

## 2021-07-31 DIAGNOSIS — I2510 Atherosclerotic heart disease of native coronary artery without angina pectoris: Secondary | ICD-10-CM | POA: Insufficient documentation

## 2021-07-31 DIAGNOSIS — R1013 Epigastric pain: Secondary | ICD-10-CM

## 2021-07-31 DIAGNOSIS — D122 Benign neoplasm of ascending colon: Secondary | ICD-10-CM | POA: Insufficient documentation

## 2021-07-31 DIAGNOSIS — K635 Polyp of colon: Secondary | ICD-10-CM

## 2021-07-31 DIAGNOSIS — Z87891 Personal history of nicotine dependence: Secondary | ICD-10-CM | POA: Diagnosis not present

## 2021-07-31 DIAGNOSIS — I10 Essential (primary) hypertension: Secondary | ICD-10-CM | POA: Diagnosis not present

## 2021-07-31 DIAGNOSIS — K259 Gastric ulcer, unspecified as acute or chronic, without hemorrhage or perforation: Secondary | ICD-10-CM | POA: Diagnosis not present

## 2021-07-31 DIAGNOSIS — Z1211 Encounter for screening for malignant neoplasm of colon: Secondary | ICD-10-CM | POA: Insufficient documentation

## 2021-07-31 DIAGNOSIS — R1010 Upper abdominal pain, unspecified: Secondary | ICD-10-CM | POA: Diagnosis not present

## 2021-07-31 DIAGNOSIS — D125 Benign neoplasm of sigmoid colon: Secondary | ICD-10-CM | POA: Insufficient documentation

## 2021-07-31 DIAGNOSIS — K3189 Other diseases of stomach and duodenum: Secondary | ICD-10-CM | POA: Insufficient documentation

## 2021-07-31 DIAGNOSIS — K621 Rectal polyp: Secondary | ICD-10-CM | POA: Insufficient documentation

## 2021-07-31 HISTORY — PX: COLONOSCOPY WITH PROPOFOL: SHX5780

## 2021-07-31 HISTORY — PX: ESOPHAGOGASTRODUODENOSCOPY (EGD) WITH PROPOFOL: SHX5813

## 2021-07-31 SURGERY — COLONOSCOPY WITH PROPOFOL
Anesthesia: General

## 2021-07-31 MED ORDER — LIDOCAINE HCL (CARDIAC) PF 100 MG/5ML IV SOSY
PREFILLED_SYRINGE | INTRAVENOUS | Status: DC | PRN
  Administered 2021-07-31: 100 mg via INTRAVENOUS

## 2021-07-31 MED ORDER — DEXMEDETOMIDINE HCL IN NACL 200 MCG/50ML IV SOLN
INTRAVENOUS | Status: DC | PRN
Start: 2021-07-31 — End: 2021-07-31
  Administered 2021-07-31: 12 ug via INTRAVENOUS

## 2021-07-31 MED ORDER — SODIUM CHLORIDE 0.9 % IV SOLN: INTRAVENOUS | Status: DC

## 2021-07-31 MED ORDER — PROPOFOL 500 MG/50ML IV EMUL
INTRAVENOUS | Status: AC
  Filled 2021-07-31: qty 50

## 2021-07-31 MED ORDER — PROPOFOL 10 MG/ML IV BOLUS
INTRAVENOUS | Status: DC | PRN
  Administered 2021-07-31: 100 mg via INTRAVENOUS

## 2021-07-31 MED ORDER — GLYCOPYRROLATE 0.2 MG/ML IJ SOLN
INTRAMUSCULAR | Status: DC | PRN
  Administered 2021-07-31: .2 mg via INTRAVENOUS

## 2021-07-31 MED ORDER — PROPOFOL 500 MG/50ML IV EMUL
INTRAVENOUS | Status: DC | PRN
  Administered 2021-07-31: 150 ug/kg/min via INTRAVENOUS

## 2021-07-31 NOTE — Op Note (Signed)
[facility] Patient Name: Paul Nielsen Procedure Date: 07/31/2021 7:20 AM MRN: [id number] Account #: [id number] Date of Birth: 1970-12-05 Admit Type: Outpatient Age: 51 Room: [facility] ENDO ROOM 1 Gender: Male Note Status: Finalized Instrument Name: Upper Endoscope [id number] Procedure:             Upper GI endoscopy Indications:           Upper abdominal pain Providers:             Lin Landsman MD, MD Medicines:             General Anesthesia Complications:         No immediate complications. Estimated blood loss: None. Procedure:             Pre-Anesthesia Assessment:                        - Prior to the procedure, a History and Physical was                         performed, and patient medications and allergies were                         reviewed. The patient is competent. The risks and                         benefits of the procedure and the sedation options and                         risks were discussed with the patient. All questions                         were answered and informed consent was obtained.                         Patient identification and proposed procedure were                         verified by the physician, the nurse, the                         anesthesiologist, the anesthetist and the technician                         in the pre-procedure area in the procedure room in the                         endoscopy suite. Mental Status Examination: alert and                         oriented. Airway Examination: normal oropharyngeal                         airway and neck mobility. Respiratory Examination:                         clear to auscultation. CV Examination: normal.                         Prophylactic Antibiotics:  The patient does not require                         prophylactic antibiotics. Prior Anticoagulants: The                         patient has taken no previous anticoagulant or                          antiplatelet agents. ASA Grade Assessment: II - A                         patient with mild systemic disease. After reviewing                         the risks and benefits, the patient was deemed in                         satisfactory condition to undergo the procedure. The                         anesthesia plan was to use general anesthesia.                         Immediately prior to administration of medications,                         the patient was re-assessed for adequacy to receive                         sedatives. The heart rate, respiratory rate, oxygen                         saturations, blood pressure, adequacy of pulmonary                         ventilation, and response to care were monitored                         throughout the procedure. The physical status of the                         patient was re-assessed after the procedure.                        After obtaining informed consent, the endoscope was                         passed under direct vision. Throughout the procedure,                         the patient's blood pressure, pulse, and oxygen                         saturations were monitored continuously. The Endoscope                         was introduced through the mouth, and advanced to the  second part of duodenum. The upper GI endoscopy was                         accomplished without difficulty. The patient tolerated                         the procedure well. Findings:      The duodenal bulb and second portion of the duodenum were normal.      A single localized 5 mm erosion with no bleeding and no stigmata of       recent bleeding was found in the gastric antrum. Biopsies were taken       with a cold forceps for Helicobacter pylori testing.      The gastric body and incisura were normal. Biopsies were taken with a       cold forceps for Helicobacter pylori testing.      The cardia and gastric fundus were normal on  retroflexion.      Esophagogastric landmarks were identified: the gastroesophageal junction       was found at 40 cm from the incisors.      The gastroesophageal junction and examined esophagus were normal. Impression:            - Normal duodenal bulb and second portion of the                         duodenum.                        - Erosive gastropathy with no bleeding and no stigmata                         of recent bleeding. Biopsied.                        - Normal gastric body and incisura. Biopsied.                        - Esophagogastric landmarks identified.                        - Normal gastroesophageal junction and esophagus. Recommendation:        - Await pathology results.                        - Proceed with colonoscopy as scheduled                        See colonoscopy report Procedure Code(s):     --- Professional ---                        [phone], Esophagogastroduodenoscopy, flexible,                         transoral; with biopsy, single or multiple Diagnosis Code(s):     --- Professional ---                        K31.89, Other diseases of stomach and duodenum  R10.10, Upper abdominal pain, unspecified CPT copyright 2019 American Medical Association. All rights reserved. The codes documented in this report are preliminary and upon coder review may  be revised to meet current compliance requirements. Dr. Ulyess Mort Lin Landsman MD, MD 07/31/2021 7:56:33 AM This report has been signed electronically. Number of Addenda: 0 Note Initiated On: 07/31/2021 7:20 AM Estimated Blood Loss:  Estimated blood loss: none.      [facility]

## 2021-07-31 NOTE — Anesthesia Postprocedure Evaluation (Signed)
Anesthesia Post Note  Patient: Barre Aydelott  Procedure(s) Performed: COLONOSCOPY WITH PROPOFOL ESOPHAGOGASTRODUODENOSCOPY (EGD) WITH PROPOFOL  Patient location during evaluation: Endoscopy Anesthesia Type: General Level of consciousness: awake and alert Pain management: pain level controlled Vital Signs Assessment: post-procedure vital signs reviewed and stable Respiratory status: spontaneous breathing, nonlabored ventilation, respiratory function stable and patient connected to nasal cannula oxygen Cardiovascular status: blood pressure returned to baseline and stable Postop Assessment: no apparent nausea or vomiting Anesthetic complications: no   No notable events documented.   Last Vitals:  Vitals:   07/31/21 0839 07/31/21 0849  BP: (!) 100/56 (!) 93/59  Pulse: 62 61  Resp: 13 12  Temp:    SpO2: 97% 97%    Last Pain:  Vitals:   07/31/21 0849  TempSrc:   PainSc: 0-No pain                 Arita Miss

## 2021-07-31 NOTE — Anesthesia Preprocedure Evaluation (Signed)
Anesthesia Evaluation  Patient identified by MRN, date of birth, ID band Patient awake    Reviewed: Allergy & Precautions, NPO status , Patient's Chart, lab work & pertinent test results  History of Anesthesia Complications Negative for: history of anesthetic complications  Airway Mallampati: II  TM Distance: >3 FB Neck ROM: Full    Dental no notable dental hx. (+) Teeth Intact   Pulmonary neg pulmonary ROS, neg sleep apnea, neg COPD, Patient abstained from smoking.Not current smoker, former smoker,    Pulmonary exam normal breath sounds clear to auscultation       Cardiovascular Exercise Tolerance: Good METS(-) hypertension+ CAD and + Cardiac Stents  (-) Past MI (-) dysrhythmias  Rhythm:Regular Rate:Normal - Systolic murmurs Good exercise [place]   Neuro/Psych negative neurological ROS  negative psych ROS   GI/Hepatic neg GERD  ,(+)     (-) substance abuse  ,   Endo/Other  neg diabetes  Renal/GU negative Renal ROS     Musculoskeletal   Abdominal   Peds  Hematology   Anesthesia Other Findings Past Medical History: No date: Coronary artery disease     Comment:  2014, 1 stent placed  Reproductive/Obstetrics                             Anesthesia Physical Anesthesia Plan  ASA: 2  Anesthesia Plan: General   Post-op Pain Management: Minimal or no pain anticipated   Induction: Intravenous  PONV Risk Score and Plan: 2 and Propofol infusion, TIVA and Ondansetron  Airway Management Planned: Nasal Cannula  Additional Equipment: None  Intra-op Plan:   Post-operative Plan:   Informed Consent: I have reviewed the patients History and Physical, chart, labs and discussed the procedure including the risks, benefits and alternatives for the proposed anesthesia with the patient or authorized representative who has indicated his/her understanding and acceptance.     Dental advisory  given  Plan Discussed with: CRNA and Surgeon  Anesthesia Plan Comments: (Discussed risks of anesthesia with patient, including possibility of difficulty with spontaneous ventilation under anesthesia necessitating airway intervention, PONV, and rare risks such as cardiac or respiratory or neurological events, and allergic reactions. Discussed the role of CRNA in patient's perioperative care. Patient understands.)        Anesthesia Quick Evaluation

## 2021-07-31 NOTE — Transfer of Care (Addendum)
Immediate Anesthesia Transfer of Care Note  Patient: Paul Nielsen  Procedure(s) Performed: COLONOSCOPY WITH PROPOFOL ESOPHAGOGASTRODUODENOSCOPY (EGD) WITH PROPOFOL  Patient Location: PACU  Anesthesia Type:General  Level of Consciousness: drowsy  Airway & Oxygen Therapy: Patient Spontanous Breathing  Post-op Assessment: Report given to RN  Post vital signs: stable  Last Vitals:  Vitals Value Taken Time  BP    Temp    Pulse    Resp    SpO2      Last Pain:  Vitals:   07/31/21 0654  TempSrc: Temporal         Complications: No notable events documented.

## 2021-07-31 NOTE — Op Note (Signed)
[facility] Patient Name: Paul Nielsen Procedure Date: 07/31/2021 7:06 AM MRN: [id number] Account #: [id number] Date of Birth: [birth year] Admit Type: Inpatient Age: 51 Room: [facility] ENDO ROOM 1 Gender: Male Note Status: Finalized Instrument Name: Jasper Riling [id number] Procedure:             Colonoscopy Indications:           Screening for colorectal malignant neoplasm, This is                         the patient's first colonoscopy Providers:             Lin Landsman MD, MD Referring MD:          no pcp Medicines:             General Anesthesia Complications:         No immediate complications. Estimated blood loss: None. Procedure:             Pre-Anesthesia Assessment:                        - Prior to the procedure, a History and Physical was                         performed, and patient medications and allergies were                         reviewed. The patient is competent. The risks and                         benefits of the procedure and the sedation options and                         risks were discussed with the patient. All questions                         were answered and informed consent was obtained.                         Patient identification and proposed procedure were                         verified by the physician, the nurse, the                         anesthesiologist, the anesthetist and the technician                         in the pre-procedure area in the procedure room in the                         endoscopy suite. Mental Status Examination: alert and                         oriented. Airway Examination: normal oropharyngeal                         airway and neck mobility. Respiratory Examination:  clear to auscultation. CV Examination: normal.                         Prophylactic Antibiotics: The patient does not require                         prophylactic antibiotics. Prior  Anticoagulants: The                         patient has taken no previous anticoagulant or                         antiplatelet agents. ASA Grade Assessment: II - A                         patient with mild systemic disease. After reviewing                         the risks and benefits, the patient was deemed in                         satisfactory condition to undergo the procedure. The                         anesthesia plan was to use general anesthesia.                         Immediately prior to administration of medications,                         the patient was re-assessed for adequacy to receive                         sedatives. The heart rate, respiratory rate, oxygen                         saturations, blood pressure, adequacy of pulmonary                         ventilation, and response to care were monitored                         throughout the procedure. The physical status of the                         patient was re-assessed after the procedure.                        After obtaining informed consent, the colonoscope was                         passed under direct vision. Throughout the procedure,                         the patient's blood pressure, pulse, and oxygen                         saturations were monitored continuously. The  Colonoscope was introduced through the anus and                         advanced to the the cecum, identified by appendiceal                         orifice and ileocecal valve. The colonoscopy was                         performed without difficulty. The patient tolerated                         the procedure well. The quality of the bowel                         preparation was evaluated using the BBPS [facility] Bowel                         Preparation Scale) with scores of: Right Colon = 3,                         Transverse Colon = 3 and Left Colon = 3 (entire mucosa                         seen well with no  residual staining, small fragments                         of stool or opaque liquid). The total BBPS score                         equals 9. Findings:      The perianal and digital rectal examinations were normal. Pertinent       negatives include normal sphincter tone and no palpable rectal lesions.      Three sessile polyps were found in the rectum, sigmoid colon and       ascending colon. The polyps were 4 to 5 mm in size. These polyps were       removed with a cold snare. Resection and retrieval were complete.       Estimated blood loss: none.      The retroflexed view of the distal rectum and anal verge was normal and       showed no anal or rectal abnormalities.      The exam was otherwise without abnormality. Impression:            - Three 4 to 5 mm polyps in the rectum, in the sigmoid                         colon and in the ascending colon, removed with a cold                         snare. Resected and retrieved.                        - The distal rectum and anal verge are normal on  retroflexion view.                        - The examination was otherwise normal. Recommendation:        - Discharge patient to home (with escort).                        - Resume previous diet today.                        - Continue present medications.                        - Await pathology results.                        - Repeat colonoscopy in 5 to 7 years for surveillance                         based on pathology results. Procedure Code(s):     --- Professional ---                        [phone], Colonoscopy, flexible; with removal of                         tumor(s), polyp(s), or other lesion(s) by snare                         technique Diagnosis Code(s):     --- Professional ---                        Z12.11, Encounter for screening for malignant neoplasm                         of colon                        K62.1, Rectal polyp                        K63.5,  Polyp of colon CPT copyright 2019 American Medical Association. All rights reserved. The codes documented in this report are preliminary and upon coder review may  be revised to meet current compliance requirements. Dr. Ulyess Mort Lin Landsman MD, MD 07/31/2021 8:17:00 AM This report has been signed electronically. Number of Addenda: 0 Note Initiated On: 07/31/2021 7:06 AM Scope Withdrawal Time: 0 hours 12 minutes 55 seconds  Total Procedure Duration: 0 hours 16 minutes 35 seconds  Estimated Blood Loss:  Estimated blood loss: none.      [facility]

## 2021-07-31 NOTE — H&P (Signed)
°  Cephas Darby, MD [address]  [place], [place] [address]  Main: [phone]  Fax: [phone] Pager: [phone]  Primary Care Physician:  Patient, No Pcp Per (Inactive) Primary Gastroenterologist:  Dr. Cephas Darby  Pre-Procedure History & Physical: HPI:  Paul Nielsen is a 51 y.o. male is here for an endoscopy and colonoscopy.   Past Medical History:  Diagnosis Date   Coronary artery disease    2014, 1 stent placed    Past Surgical History:  Procedure Laterality Date   CHOLECYSTECTOMY      Prior to Admission medications   Medication Sig Start Date End Date Taking? Authorizing Provider  ASPIRIN 81 PO Take 1 tablet by mouth daily.   Yes [provider]  atorvastatin (LIPITOR) 40 MG tablet Take 40 mg by mouth daily.   Yes [provider]  lisinopril (PRINIVIL,ZESTRIL) 2.5 MG tablet Take 2.5 mg by mouth daily.  01/29/20  [provider]    Allergies as of 06/14/2021   (No Known Allergies)    Family History  Problem Relation Age of Onset   Cancer Mother        breast    Social History   Socioeconomic History   Marital status: Married    Spouse name: Not on file   Number of children: Not on file   Years of education: Not on file   Highest education level: Not on file  Occupational History   Not on file  Tobacco Use   Smoking status: Former    Types: Cigarettes    Quit date: 05/02/2013    Years since quitting: 8.2   Smokeless tobacco: Never  Vaping Use   Vaping Use: Never used  Substance and Sexual Activity   Alcohol use: Yes    Comment: socially   Drug use: No   Sexual activity: Not on file  Other Topics Concern   Not on file  Social History Narrative   Not on file   Social Determinants of Health   Financial Resource Strain: Not on file  Food Insecurity: Not on file  Transportation Needs: Not on file  Physical Activity: Not on file  Stress: Not on file  Social Connections: Not on file   Intimate Partner Violence: Not on file    Review of Systems: See HPI, otherwise negative ROS  Physical Exam: BP 106/73    Pulse 70    Temp (!) 96.3 F (35.7 C) (Temporal)    Resp 16    Ht 6\' 2"  (1.88 m)    Wt 117 kg    SpO2 98%    BMI 33.13 kg/m  General:   Alert,  pleasant and cooperative in NAD Head:  Normocephalic and atraumatic. Neck:  Supple; no masses or thyromegaly. Lungs:  Clear throughout to auscultation.    Heart:  Regular rate and rhythm. Abdomen:  Soft, nontender and nondistended. Normal bowel sounds, without guarding, and without rebound.   Neurologic:  Alert and  oriented x4;  grossly normal neurologically.  Impression/Plan: Cyril Woodmansee is here for an endoscopy and colonoscopy to be performed for upper abdominal discomfort, colon cancer screening  Risks, benefits, limitations, and alternatives regarding  endoscopy and colonoscopy have been reviewed with the patient.  Questions have been answered.  All parties agreeable.   Sherri Sear, MD  07/31/2021, 7:44 AM

## 2021-08-01 ENCOUNTER — Encounter: Payer: Self-pay | Admitting: Gastroenterology

## 2021-08-01 LAB — SURGICAL PATHOLOGY

## 2021-08-04 DIAGNOSIS — R0602 Shortness of breath: Secondary | ICD-10-CM | POA: Diagnosis not present

## 2021-08-04 DIAGNOSIS — Z79899 Other long term (current) drug therapy: Secondary | ICD-10-CM | POA: Diagnosis not present

## 2021-08-04 DIAGNOSIS — Z87891 Personal history of nicotine dependence: Secondary | ICD-10-CM | POA: Diagnosis not present

## 2021-08-04 DIAGNOSIS — Z23 Encounter for immunization: Secondary | ICD-10-CM | POA: Diagnosis not present

## 2021-08-24 DIAGNOSIS — R0602 Shortness of breath: Secondary | ICD-10-CM | POA: Diagnosis not present

## 2021-09-07 DIAGNOSIS — R0609 Other forms of dyspnea: Secondary | ICD-10-CM | POA: Diagnosis not present

## 2021-09-07 DIAGNOSIS — R1013 Epigastric pain: Secondary | ICD-10-CM | POA: Diagnosis not present

## 2021-09-07 DIAGNOSIS — I25119 Atherosclerotic heart disease of native coronary artery with unspecified angina pectoris: Secondary | ICD-10-CM | POA: Diagnosis not present

## 2021-09-07 DIAGNOSIS — E782 Mixed hyperlipidemia: Secondary | ICD-10-CM | POA: Diagnosis not present

## 2021-09-25 DIAGNOSIS — I25119 Atherosclerotic heart disease of native coronary artery with unspecified angina pectoris: Secondary | ICD-10-CM | POA: Diagnosis not present

## 2021-09-25 DIAGNOSIS — R1013 Epigastric pain: Secondary | ICD-10-CM | POA: Diagnosis not present

## 2021-09-25 DIAGNOSIS — R0609 Other forms of dyspnea: Secondary | ICD-10-CM | POA: Diagnosis not present

## 2022-01-01 ENCOUNTER — Ambulatory Visit
Admission: EM | Admit: 2022-01-01 | Discharge: 2022-01-01 | Disposition: A | Discharge status: Home / Self Care | Payer: BC Managed Care – PPO | Attending: Emergency Medicine | Admitting: Emergency Medicine

## 2022-01-01 DIAGNOSIS — L259 Unspecified contact dermatitis, unspecified cause: Secondary | ICD-10-CM | POA: Diagnosis not present

## 2022-01-01 DIAGNOSIS — T7840XA Allergy, unspecified, initial encounter: Secondary | ICD-10-CM

## 2022-01-01 MED ORDER — PREDNISONE 10 MG (21) PO TBPK: ORAL_TABLET | ORAL | 0 refills | Status: DC

## 2022-01-01 MED ORDER — HYDROCORTISONE 2.5 % EX OINT: TOPICAL_OINTMENT | Freq: Two times a day (BID) | CUTANEOUS | 0 refills | Status: AC

## 2022-01-01 NOTE — Discharge Instructions (Signed)
Take Claritin or Zyrtec.  Try the hydrocortisone ointment first.  Use it only as needed.  If this does not help, then try the prednisone.  Follow-up with your primary care provider or may return here if you are not better after finishing the prednisone

## 2022-01-01 NOTE — ED Provider Notes (Signed)
HPI  SUBJECTIVE:  Paul Nielsen is a 51 y.o. male who presents with bilateral eyelid and infraorbital "puffiness" and itching this morning.  No periorbital erythema, pain with extraocular movements, eye pain, photophobia.  He reports intense itching on his arms, legs and on his left ear starting last night.  He noticed a red rash this morning.  He has scratched areas on his arms to scabbing.  He did yard work 2 days ago, but does not recall any exposure to poison ivy or poison oak.  No new lotions, soaps, detergents, medications, foods.  No recent antibiotics.  No allergy symptoms of nasal congestion, rhinorrhea, sneezing.  No lip or tongue swelling, cough, wheezing, shortness of breath, nausea, vomiting, diarrhea, Donnell pain, syncope.  No contacts with similar symptoms.  No sensation being bitten at night.  He tried 75 mg of Benadryl without improvement in symptoms.  There are no aggravating factors.  He has a past medical history of coronary artery disease.  No history of diabetes, chronic kidney disease.  PCP: [facility].    Past Medical History:  Diagnosis Date   Coronary artery disease    2014, 1 stent placed    Past Surgical History:  Procedure Laterality Date   CHOLECYSTECTOMY     COLONOSCOPY WITH PROPOFOL N/A 07/31/2021   Procedure: COLONOSCOPY WITH PROPOFOL;  Surgeon: Lin Landsman, MD;  Location: [facility] ENDOSCOPY;  Service: Gastroenterology;  Laterality: N/A;   ESOPHAGOGASTRODUODENOSCOPY (EGD) WITH PROPOFOL N/A 07/31/2021   Procedure: ESOPHAGOGASTRODUODENOSCOPY (EGD) WITH PROPOFOL;  Surgeon: Lin Landsman, MD;  Location: [facility] ENDOSCOPY;  Service: Gastroenterology;  Laterality: N/A;    Family History  Problem Relation Age of Onset   Cancer Mother        breast    Social History   Tobacco Use   Smoking status: Former    Types: Cigarettes    Quit date: 05/02/2013    Years since quitting: 8.6   Smokeless tobacco: Never  Vaping Use   Vaping Use: Never used   Substance Use Topics   Alcohol use: Yes    Comment: socially   Drug use: No    No current facility-administered medications for this encounter.  Current Outpatient Medications:    ASPIRIN 81 PO, Take 1 tablet by mouth daily., Disp: , Rfl:    atorvastatin (LIPITOR) 40 MG tablet, Take 40 mg by mouth daily., Disp: , Rfl:    hydrocortisone 2.5 % ointment, Apply topically 2 (two) times daily for 14 days., Disp: 30 g, Rfl: 0   predniSONE (STERAPRED UNI-PAK 21 TAB) 10 MG (21) TBPK tablet, Dispense one 6 day pack. Take as directed with food., Disp: 21 tablet, Rfl: 0  No Known Allergies   ROS  As noted in HPI.   Physical Exam  BP 121/84 (BP Location: Left Arm)   Pulse 84   Temp 98 F (36.7 C) (Oral)   Resp 16   Ht '6\' 2"'$  (1.88 m)   Wt 115.7 kg   SpO2 96%   BMI 32.74 kg/m   Constitutional: Well developed, well nourished, no acute distress Eyes:  EOMI, conjunctiva normal bilaterally, no pain with EOMs.  Mild nontender edema upper eyelid and inferior to the eye.  No periorbital erythema. HENT: Normocephalic, atraumatic,mucus membranes moist Respiratory: Normal inspiratory effort, lungs clear bilaterally Cardiovascular: Normal rate GI: nondistended skin: Erythematous, blanchable, maculopapular rash on forearms with some excoriations.  Positive erythema, swelling left ear.  No vesicles.     Musculoskeletal: no deformities Neurologic: Alert &  oriented x 3, no focal neuro deficits Psychiatric: Speech and behavior appropriate   ED Course   Medications - No data to display  No orders of the defined types were placed in this encounter.   No results found for this or any previous visit (from the past 24 hour(s)). No results found.  ED Clinical Impression  1. Contact dermatitis, unspecified contact dermatitis type, unspecified trigger   2. Allergic reaction, initial encounter      ED Assessment/Plan  Suspect a contact dermatitis from something working out in the yard.   It does not appear to be classic poison ivy.  Will send home with Claritin or Zyrtec, try steroid cream first because it seems to be in several limited areas, and if that does not work, wait-and-see prescription of prednisone for 6 days.  Follow-up with PCP as needed.  Discussed MDM, treatment plan, and plan for follow-up with patient. patient agrees with plan.   Meds ordered this encounter  Medications   hydrocortisone 2.5 % ointment    Sig: Apply topically 2 (two) times daily for 14 days.    Dispense:  30 g    Refill:  0   predniSONE (STERAPRED UNI-PAK 21 TAB) 10 MG (21) TBPK tablet    Sig: Dispense one 6 day pack. Take as directed with food.    Dispense:  21 tablet    Refill:  0      *This clinic note was created using [employer]. Therefore, there may be occasional mistakes despite careful proofreading.  ?    Melynda Ripple, MD 01/01/22 1021

## 2022-01-01 NOTE — ED Triage Notes (Signed)
Pt c/o itching, eye swelling x2days.   Pt states that he did yard work 2 days ago and felt welts along his arm that he thought was mosquito bites.  Pt is worried about swelling along his eyes.

## 2022-01-12 ENCOUNTER — Telehealth: Payer: BC Managed Care – PPO | Admitting: Physician Assistant

## 2022-01-12 DIAGNOSIS — L309 Dermatitis, unspecified: Secondary | ICD-10-CM | POA: Diagnosis not present

## 2022-01-12 MED ORDER — PREDNISONE 10 MG PO TABS: ORAL_TABLET | ORAL | 0 refills | Status: AC

## 2022-01-12 NOTE — Progress Notes (Signed)
E-Visit for [employer]  We are sorry that you are not feeing well.  Here is how we plan to help!  Based on what you have shared with me it looks like you are now having a rebound dermatitis from being prescribed a shorter steroid course for your poison ivy rash. Typically we will do a longer taper to fully resolve symptoms and prevent rebound rash. Please see treatment plan below:   This blistering rash is often called poison ivy rash although it can come from contact with the leaves, stems and roots of poison ivy, poison oak and poison sumac. The oily resin contains urushiol (u-ROO-she-ol) that produces a skin rash on exposed skin.  The severity of the rash depends on the amount of urushiol that gets on your skin.  A section of skin with more urushiol on it may develop a rash sooner.  The rash usually develops 12-48 hours after exposure and can last two to three weeks.  Your skin must come in direct contact with the plant's oil to be affected.  Blister fluid doesn't spread the rash.  However, if you come into contact with a piece of clothing or pet fur that has urushiol on it, the rash may spread out.  You can also transfer the oil to other parts of your body with your fingers.  Often the rash looks like a straight line because of the way the plant brushes against your skin.  Since your rash is widespread or has resulted in a large number of blisters, I have prescribed an oral corticosteroid.  Please follow these recommendations:  I have sent a prednisone dose pack to your chosen pharmacy. Be sure to follow the instructions carefully and complete the entire prescription. You may use Benadryl or Caladryl topical lotions to sooth the itch and remember cool, not hot, showers and baths can help relieve the itching!  Place cool, wet compresses on the affected area for 15-30 minutes several times a day.  You may also take oral antihistamines, such as diphenhydramine (Benadryl, others), which may also help you  sleep better.  Watch your skin for any purulent (pus) drainage or red streaking from the site.  If this occurs, contact your provider.  You may require an antibiotic for a skin infection.  Make sure that the clothes you were wearing as well as any towels or sheets that may have come in contact with the oil (urushiol) are washed in detergent and hot water.       I have developed the following plan to treat your condition I am prescribing a two week course of steroids (37 tablets of 10 mg prednisone).  Days 1-4 take 4 tablets (40 mg) daily  Days 5-8 take 3 tablets (30 mg) daily, Days 9-11 take 2 tablets (20 mg) daily, Days 12-14 take 1 tablet (10 mg) daily.    What can you do to prevent this rash?  Avoid the plants.  Learn how to identify poison ivy, poison oak and poison sumac in all seasons.  When hiking or engaging in other activities that might expose you to these plants, try to stay on cleared pathways.  If camping, make sure you pitch your tent in an area free of these plants.  Keep pets from running through wooded areas so that urushiol doesn't accidentally stick to their fur, which you may touch.  Remove or kill the plants.  In your yard, you can get rid of poison ivy by applying an herbicide or pulling  it out of the ground, including the roots, while wearing heavy gloves.  Afterward remove the gloves and thoroughly wash them and your hands.  Don't burn poison ivy or related plants because the urushiol can be carried by smoke.  Wear protective clothing.  If needed, protect your skin by wearing socks, boots, pants, long sleeves and vinyl gloves.  Wash your skin right away.  Washing off the oil with soap and water within 30 minutes of exposure may reduce your chances of getting a poison ivy rash.  Even washing after an hour or so can help reduce the severity of the rash.  If you walk through some poison ivy and then later touch your shoes, you may get some urushiol on your hands, which may then  transfer to your face or body by touching or rubbing.  If the contaminated object isn't cleaned, the urushiol on it can still cause a skin reaction years later.    Be careful not to reuse towels after you have washed your skin.  Also carefully wash clothing in detergent and hot water to remove all traces of the oil.  Handle contaminated clothing carefully so you don't transfer the urushiol to yourself, furniture, rugs or appliances.  Remember that pets can carry the oil on their fur and paws.  If you think your pet may be contaminated with urushiol, put on some long rubber gloves and give your pet a bath.  Finally, be careful not to burn these plants as the smoke can contain traces of the oil.  Inhaling the smoke may result in difficulty breathing. If that occurred you should see a physician as soon as possible.  See your doctor right away if:  The reaction is severe or widespread You inhaled the smoke from burning poison ivy and are having difficulty breathing Your skin continues to swell The rash affects your eyes, mouth or genitals Blisters are oozing pus You develop a fever greater than 100 F (37.8 C) The rash doesn't get better within a few weeks.  If you scratch the poison ivy rash, bacteria under your fingernails may cause the skin to become infected.  See your doctor if pus starts oozing from the blisters.  Treatment generally includes antibiotics.  Poison ivy treatments are usually limited to self-care methods.  And the rash typically goes away on its own in two to three weeks.     If the rash is widespread or results in a large number of blisters, your doctor may prescribe an oral corticosteroid, such as prednisone.  If a bacterial infection has developed at the rash site, your doctor may give you a prescription for an oral antibiotic.  MAKE SURE YOU  Understand these instructions. Will watch your condition. Will get help right away if you are not doing well or get  worse.   Thank you for choosing an e-visit.  Your e-visit answers were reviewed by a board certified advanced clinical practitioner to complete your personal care plan. Depending upon the condition, your plan could have included both over the counter or prescription medications.  Please review your pharmacy choice. Make sure the pharmacy is open so you can pick up prescription now. If there is a problem, you may contact your provider through [employer] and have the prescription routed to another pharmacy.  Your safety is important to [place]. If you have drug allergies check your prescription carefully.   For the next 24 hours you can use MyChart to ask questions about today's visit,  request a non-urgent call back, or ask for a work or school excuse. You will get an email in the next two days asking about your experience. I hope that your e-visit has been valuable and will speed your recovery.

## 2022-01-12 NOTE — Progress Notes (Signed)
I have spent 5 minutes in review of e-visit questionnaire, review and updating patient chart, medical decision making and response to patient.   Zykeem Bauserman Cody Alesi Zachery, PA-C    

## 2022-07-10 DIAGNOSIS — E6609 Other obesity due to excess calories: Secondary | ICD-10-CM | POA: Diagnosis not present

## 2022-07-10 DIAGNOSIS — Z79899 Other long term (current) drug therapy: Secondary | ICD-10-CM | POA: Diagnosis not present

## 2022-07-10 DIAGNOSIS — Z23 Encounter for immunization: Secondary | ICD-10-CM | POA: Diagnosis not present

## 2022-07-10 DIAGNOSIS — R7303 Prediabetes: Secondary | ICD-10-CM | POA: Diagnosis not present

## 2022-07-10 DIAGNOSIS — Z6834 Body mass index (BMI) 34.0-34.9, adult: Secondary | ICD-10-CM | POA: Diagnosis not present

## 2022-07-10 DIAGNOSIS — R590 Localized enlarged lymph nodes: Secondary | ICD-10-CM | POA: Diagnosis not present

## 2022-09-11 DIAGNOSIS — R7303 Prediabetes: Secondary | ICD-10-CM | POA: Diagnosis not present

## 2022-09-11 DIAGNOSIS — I25119 Atherosclerotic heart disease of native coronary artery with unspecified angina pectoris: Secondary | ICD-10-CM | POA: Diagnosis not present

## 2022-11-14 DIAGNOSIS — Z630 Problems in relationship with spouse or partner: Secondary | ICD-10-CM | POA: Diagnosis not present

## 2022-11-14 DIAGNOSIS — F4323 Adjustment disorder with mixed anxiety and depressed mood: Secondary | ICD-10-CM | POA: Diagnosis not present

## 2022-12-11 DIAGNOSIS — Z87891 Personal history of nicotine dependence: Secondary | ICD-10-CM | POA: Diagnosis not present

## 2022-12-11 DIAGNOSIS — T63461A Toxic effect of venom of wasps, accidental (unintentional), initial encounter: Secondary | ICD-10-CM | POA: Diagnosis not present

## 2022-12-11 DIAGNOSIS — I252 Old myocardial infarction: Secondary | ICD-10-CM | POA: Diagnosis not present

## 2022-12-11 DIAGNOSIS — L539 Erythematous condition, unspecified: Secondary | ICD-10-CM | POA: Diagnosis not present

## 2022-12-11 DIAGNOSIS — X58XXXA Exposure to other specified factors, initial encounter: Secondary | ICD-10-CM | POA: Diagnosis not present

## 2022-12-11 DIAGNOSIS — I2510 Atherosclerotic heart disease of native coronary artery without angina pectoris: Secondary | ICD-10-CM | POA: Diagnosis not present

## 2022-12-11 DIAGNOSIS — R0602 Shortness of breath: Secondary | ICD-10-CM | POA: Diagnosis not present

## 2022-12-11 DIAGNOSIS — T782XXA Anaphylactic shock, unspecified, initial encounter: Secondary | ICD-10-CM | POA: Diagnosis not present

## 2022-12-17 ENCOUNTER — Ambulatory Visit
Admission: EM | Admit: 2022-12-17 | Discharge: 2022-12-17 | Disposition: A | Discharge status: Home / Self Care | Payer: BC Managed Care – PPO

## 2022-12-17 DIAGNOSIS — L299 Pruritus, unspecified: Secondary | ICD-10-CM | POA: Diagnosis not present

## 2022-12-17 DIAGNOSIS — Z87892 Personal history of anaphylaxis: Secondary | ICD-10-CM | POA: Diagnosis not present

## 2022-12-17 DIAGNOSIS — T63461A Toxic effect of venom of wasps, accidental (unintentional), initial encounter: Secondary | ICD-10-CM | POA: Diagnosis not present

## 2022-12-17 DIAGNOSIS — R21 Rash and other nonspecific skin eruption: Secondary | ICD-10-CM | POA: Diagnosis not present

## 2022-12-17 MED ORDER — PREDNISONE 10 MG PO TABS: ORAL_TABLET | ORAL | 0 refills | Status: DC

## 2022-12-17 NOTE — Discharge Instructions (Addendum)
-  As discussed, it is not abnormal for people to feel poorly for a week or so following an anaphylactic reaction. - Since you still have a rash on her foot and some remaining itching symptoms I sent 5 more days of prednisone to the pharmacy.  Continue with antihistamines as long as you have the itching. - If at any point you start to have any severe headaches, vision changes, dizziness, chest pain, palpitations, shortness of breath, abdominal pain, vomiting, syncope/presyncope, please go to the emergency department for reevaluation.

## 2022-12-17 NOTE — ED Provider Notes (Signed)
[facility]    CSN: [id number] Arrival date & time: 12/17/22  4098      History   Chief Complaint Chief Complaint  Patient presents with   Allergic Reaction    Had a reaction and still having effects - Entered by patient    HPI Quasim Belarde is a 52 y.o. male presenting for rash of the right foot and pruritus of foot which has continued following allergic reaction.  Patient seen at ED 6 days ago after anaphylactic reaction to wasp sting. He was given epi, antihistamines and corticosteroids. He says he took prednisone x 4 day and completed them 2 days ago. Over the past 2 days, he says he has not been feeling well. States he feels a tingling and burning sensation throughout his whole body. He denies headaches, dizziness, chest pain, palpitations, shortness of breath, abdominal pain, nausea/vomiting, facial or throat swelling. Has been continuing to take antihistamines. Denies ever having an anaphylactic reaction before and says he does not know when he is supposed to feel better again.  HPI  Past Medical History:  Diagnosis Date   Coronary artery disease    2014, 1 stent placed    Patient Active Problem List   Diagnosis Date Noted   Abdominal pain, chronic, epigastric    Gastric erosion    Colon cancer screening    Polyp of colon    History of COVID-19 11/21/2020   Acute non-recurrent maxillary sinusitis 11/21/2020   CAD (coronary artery disease), native coronary artery 02/07/2016   History of tobacco abuse 02/07/2016   Obese 02/07/2016    Past Surgical History:  Procedure Laterality Date   CHOLECYSTECTOMY     COLONOSCOPY WITH PROPOFOL N/A 07/31/2021   Procedure: COLONOSCOPY WITH PROPOFOL;  Surgeon: Toney Reil, MD;  Location: [facility] ENDOSCOPY;  Service: Gastroenterology;  Laterality: N/A;   ESOPHAGOGASTRODUODENOSCOPY (EGD) WITH PROPOFOL N/A 07/31/2021   Procedure: ESOPHAGOGASTRODUODENOSCOPY (EGD) WITH PROPOFOL;  Surgeon: Toney Reil, MD;   Location: [facility] ENDOSCOPY;  Service: Gastroenterology;  Laterality: N/A;       Home Medications    Prior to Admission medications   Medication Sig Start Date End Date Taking? Authorizing Provider  ASPIRIN 81 PO Take 1 tablet by mouth daily.   Yes [provider]  atorvastatin (LIPITOR) 40 MG tablet Take 40 mg by mouth daily.   Yes [provider]  EPINEPHrine 0.3 mg/0.3 mL IJ SOAJ injection Inject into the muscle.   Yes [provider]  predniSONE (DELTASONE) 10 MG tablet Take 5 tabs PO daily on day 1 and decrease by 1 tab daily until complete 12/17/22  Yes Eusebio Friendly B, PA-C  lisinopril (PRINIVIL,ZESTRIL) 2.5 MG tablet Take 2.5 mg by mouth daily.  01/29/20  [provider]    Family History Family History  Problem Relation Age of Onset   Cancer Mother        breast    Social History Social History   Tobacco Use   Smoking status: Former    Types: Cigarettes    Quit date: 05/02/2013    Years since quitting: 9.6   Smokeless tobacco: Never  Vaping Use   Vaping Use: Never used  Substance Use Topics   Alcohol use: Yes    Comment: socially   Drug use: No     Allergies   Patient has no known allergies.   Review of Systems Review of Systems  Constitutional:  Negative for fatigue.  HENT:  Negative for congestion and trouble swallowing.  Respiratory:  Negative for chest tightness, shortness of breath and wheezing.   Cardiovascular:  Negative for chest pain.  Gastrointestinal:  Negative for diarrhea and vomiting.  Skin:  Positive for rash.  Neurological:  Negative for dizziness, weakness and headaches.     Physical Exam Triage Vital Signs ED Triage Vitals  Enc Vitals Group     BP      Pulse      Resp      Temp      Temp src      SpO2      Weight      Height      Head Circumference      Peak Flow      Pain Score      Pain Loc      Pain Edu?      Excl. in GC?    No data found.  Updated Vital Signs BP (!) 147/94 (BP  Location: Right Arm)   Pulse 86   Temp 97.9 F (36.6 C) (Oral)   Resp 16   Ht 6' 1.5" (1.867 m)   Wt 249 lb (112.9 kg)   SpO2 95%   BMI 32.41 kg/m     Physical Exam Vitals and nursing note reviewed.  Constitutional:      General: He is not in acute distress.    Appearance: Normal appearance. He is well-developed. He is not ill-appearing.  HENT:     Head: Normocephalic and atraumatic.     Nose: Nose normal.     Mouth/Throat:     Mouth: Mucous membranes are moist.     Pharynx: Oropharynx is clear.     Comments: No facial or intraoral swelling noted. Eyes:     General: No scleral icterus.    Conjunctiva/sclera: Conjunctivae normal.  Cardiovascular:     Rate and Rhythm: Normal rate and regular rhythm.     Heart sounds: Normal heart sounds.  Pulmonary:     Effort: Pulmonary effort is normal. No respiratory distress.     Breath sounds: Normal breath sounds. No wheezing, rhonchi or rales.  Musculoskeletal:     Cervical back: Neck supple.  Skin:    General: Skin is warm and dry.     Capillary Refill: Capillary refill takes less than 2 seconds.     Findings: Rash (fine maculopapular and petechial rash of right dorsal-lateral foot) present.  Neurological:     General: No focal deficit present.     Mental Status: He is alert. Mental status is at baseline.     Motor: No weakness.     Gait: Gait normal.  Psychiatric:        Mood and Affect: Mood normal.        Behavior: Behavior normal.      UC Treatments / Results  Labs (all labs ordered are listed, but only abnormal results are displayed) Labs Reviewed - No data to display  EKG   Radiology No results found.  Procedures Procedures (including critical care time)  Medications Ordered in UC Medications - No data to display  Initial Impression / Assessment and Plan / UC Course  I have reviewed the triage vital signs and the nursing notes.  Pertinent labs & imaging results that were available during my care of  the patient were reviewed by me and considered in my medical decision making (see chart for details).   52 year old male presents for continued symptoms following anaphylactic reaction to wasp sting 6 days ago.  He  was treated in the emergency department with epinephrine, antihistamines and corticosteroids and took prednisone x 4 days after.  States he finished the prednisone 2 days ago.  Reports continued pruritus and discomfort of the right foot which is where he was initially stung.  Reports tingling and burning sensation throughout body over the past couple days.  Denies any associated swelling, difficulty swallowing, difficulty breathing.  Blood pressure slightly elevated at 147/94.  Other vitals normal and stable.  He is overall well-appearing.  No facial or intraoral swelling.  No acute distress.  Heart regular rate and rhythm and chest clear to auscultation.  Additionally he has fine maculopapular petechial rash of the right foot.  Suspect possible continued allergic reaction.  Some of the symptoms may also be related to the anaphylactic reaction that he had.  Explained that his body went through major response and he is also doing a lot of different medications that could make him not feel well.  Will treat with 5 more days of prednisone and hopefully this helps his symptoms.  Advised continue antihistamines.  Advised following up with PCP or returning for any continued or worsening symptoms after the next few days.   Final Clinical Impressions(s) / UC Diagnoses   Final diagnoses:  Rash of foot  Wasp sting, accidental or unintentional, initial encounter  Pruritus  History of anaphylaxis     Discharge Instructions      -As discussed, it is not abnormal for people to feel poorly for a week or so following an anaphylactic reaction. - Since you still have a rash on her foot and some remaining itching symptoms I sent 5 more days of prednisone to the pharmacy.  Continue with antihistamines  as long as you have the itching. - If at any point you start to have any severe headaches, vision changes, dizziness, chest pain, palpitations, shortness of breath, abdominal pain, vomiting, syncope/presyncope, please go to the emergency department for reevaluation.     ED Prescriptions     Medication Sig Dispense Auth. Provider   predniSONE (DELTASONE) 10 MG tablet Take 5 tabs PO daily on day 1 and decrease by 1 tab daily until complete 15 tablet Shirlee Latch, PA-C      PDMP not reviewed this encounter.   Shirlee Latch, PA-C 12/17/22 1026

## 2022-12-17 NOTE — ED Triage Notes (Signed)
Pt c/o allergic rxn x6 days. Was seen at [facility] ED on 6/11 for insect bite in R foot. Was given Epipen & prednisone for 4 days. States area in foot feels like a burning sensation & has a rash.

## 2022-12-18 DIAGNOSIS — F4323 Adjustment disorder with mixed anxiety and depressed mood: Secondary | ICD-10-CM | POA: Diagnosis not present

## 2022-12-18 DIAGNOSIS — Z630 Problems in relationship with spouse or partner: Secondary | ICD-10-CM | POA: Diagnosis not present

## 2023-01-04 DIAGNOSIS — T63441A Toxic effect of venom of bees, accidental (unintentional), initial encounter: Secondary | ICD-10-CM | POA: Diagnosis not present

## 2023-01-04 DIAGNOSIS — I2510 Atherosclerotic heart disease of native coronary artery without angina pectoris: Secondary | ICD-10-CM | POA: Diagnosis not present

## 2023-01-04 DIAGNOSIS — X58XXXA Exposure to other specified factors, initial encounter: Secondary | ICD-10-CM | POA: Diagnosis not present

## 2023-01-04 DIAGNOSIS — T7840XA Allergy, unspecified, initial encounter: Secondary | ICD-10-CM | POA: Diagnosis not present

## 2023-01-04 DIAGNOSIS — Z955 Presence of coronary angioplasty implant and graft: Secondary | ICD-10-CM | POA: Diagnosis not present

## 2023-01-08 DIAGNOSIS — T63461D Toxic effect of venom of wasps, accidental (unintentional), subsequent encounter: Secondary | ICD-10-CM | POA: Diagnosis not present

## 2023-01-08 DIAGNOSIS — Z23 Encounter for immunization: Secondary | ICD-10-CM | POA: Diagnosis not present

## 2023-01-08 DIAGNOSIS — I252 Old myocardial infarction: Secondary | ICD-10-CM | POA: Diagnosis not present

## 2023-01-08 DIAGNOSIS — Z13220 Encounter for screening for lipoid disorders: Secondary | ICD-10-CM | POA: Diagnosis not present

## 2023-01-08 DIAGNOSIS — T782XXD Anaphylactic shock, unspecified, subsequent encounter: Secondary | ICD-10-CM | POA: Diagnosis not present

## 2023-03-05 DIAGNOSIS — Z131 Encounter for screening for diabetes mellitus: Secondary | ICD-10-CM | POA: Diagnosis not present

## 2023-03-05 DIAGNOSIS — Z130 Encounter for screening for diseases of the blood and blood-forming organs and certain disorders involving the immune mechanism: Secondary | ICD-10-CM | POA: Diagnosis not present

## 2023-03-05 DIAGNOSIS — Z13220 Encounter for screening for lipoid disorders: Secondary | ICD-10-CM | POA: Diagnosis not present

## 2023-03-05 DIAGNOSIS — Z125 Encounter for screening for malignant neoplasm of prostate: Secondary | ICD-10-CM | POA: Diagnosis not present

## 2023-03-08 DIAGNOSIS — R7301 Impaired fasting glucose: Secondary | ICD-10-CM | POA: Diagnosis not present

## 2023-03-08 DIAGNOSIS — Z0000 Encounter for general adult medical examination without abnormal findings: Secondary | ICD-10-CM | POA: Diagnosis not present

## 2023-03-08 DIAGNOSIS — Z23 Encounter for immunization: Secondary | ICD-10-CM | POA: Diagnosis not present

## 2023-04-17 IMAGING — CR DG CHEST 2V
2 series · 3 of 3 positions shown · non-contrast
Comparison: None.

CLINICAL DATA: Recent COVID, pain shortness of breath, chest and
abdominal pressure after eating.

EXAM:
CHEST - 2 VIEW

[chest pa]
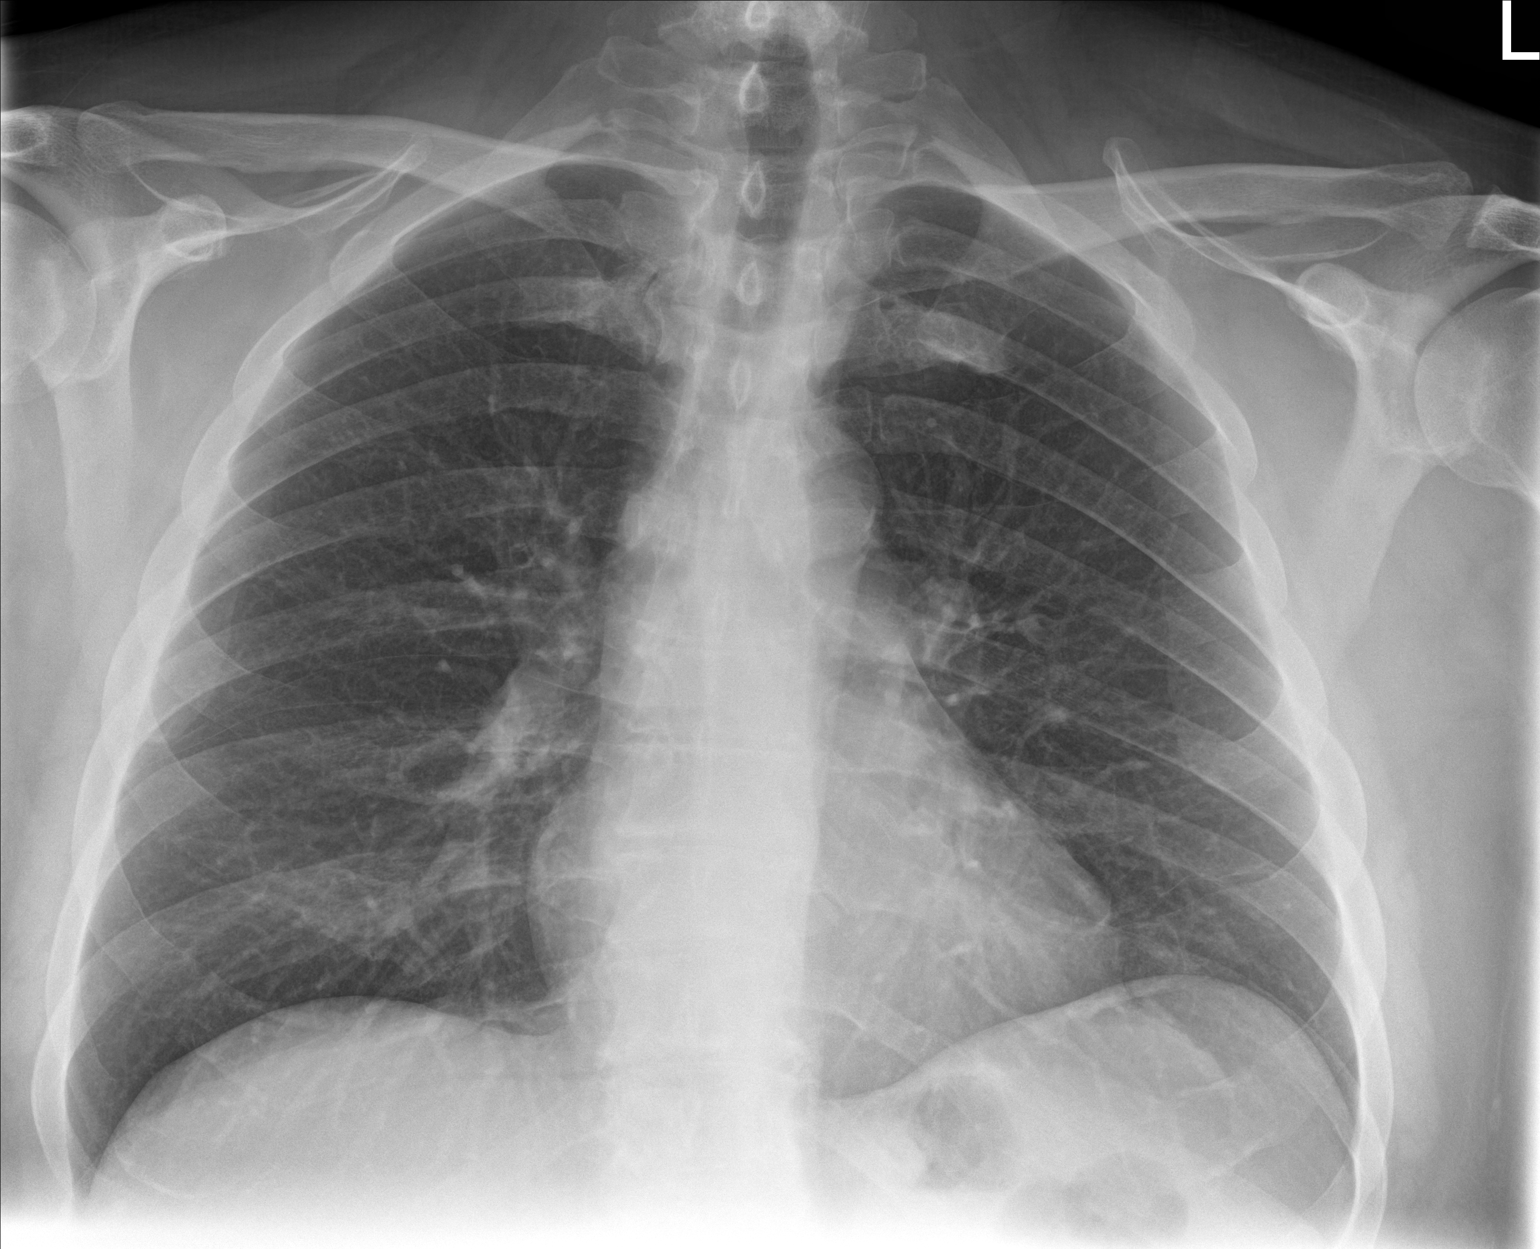

[Series 2: chest lat · 0.14mm/px · 2 of 2 slices shown]
[im 1/2]
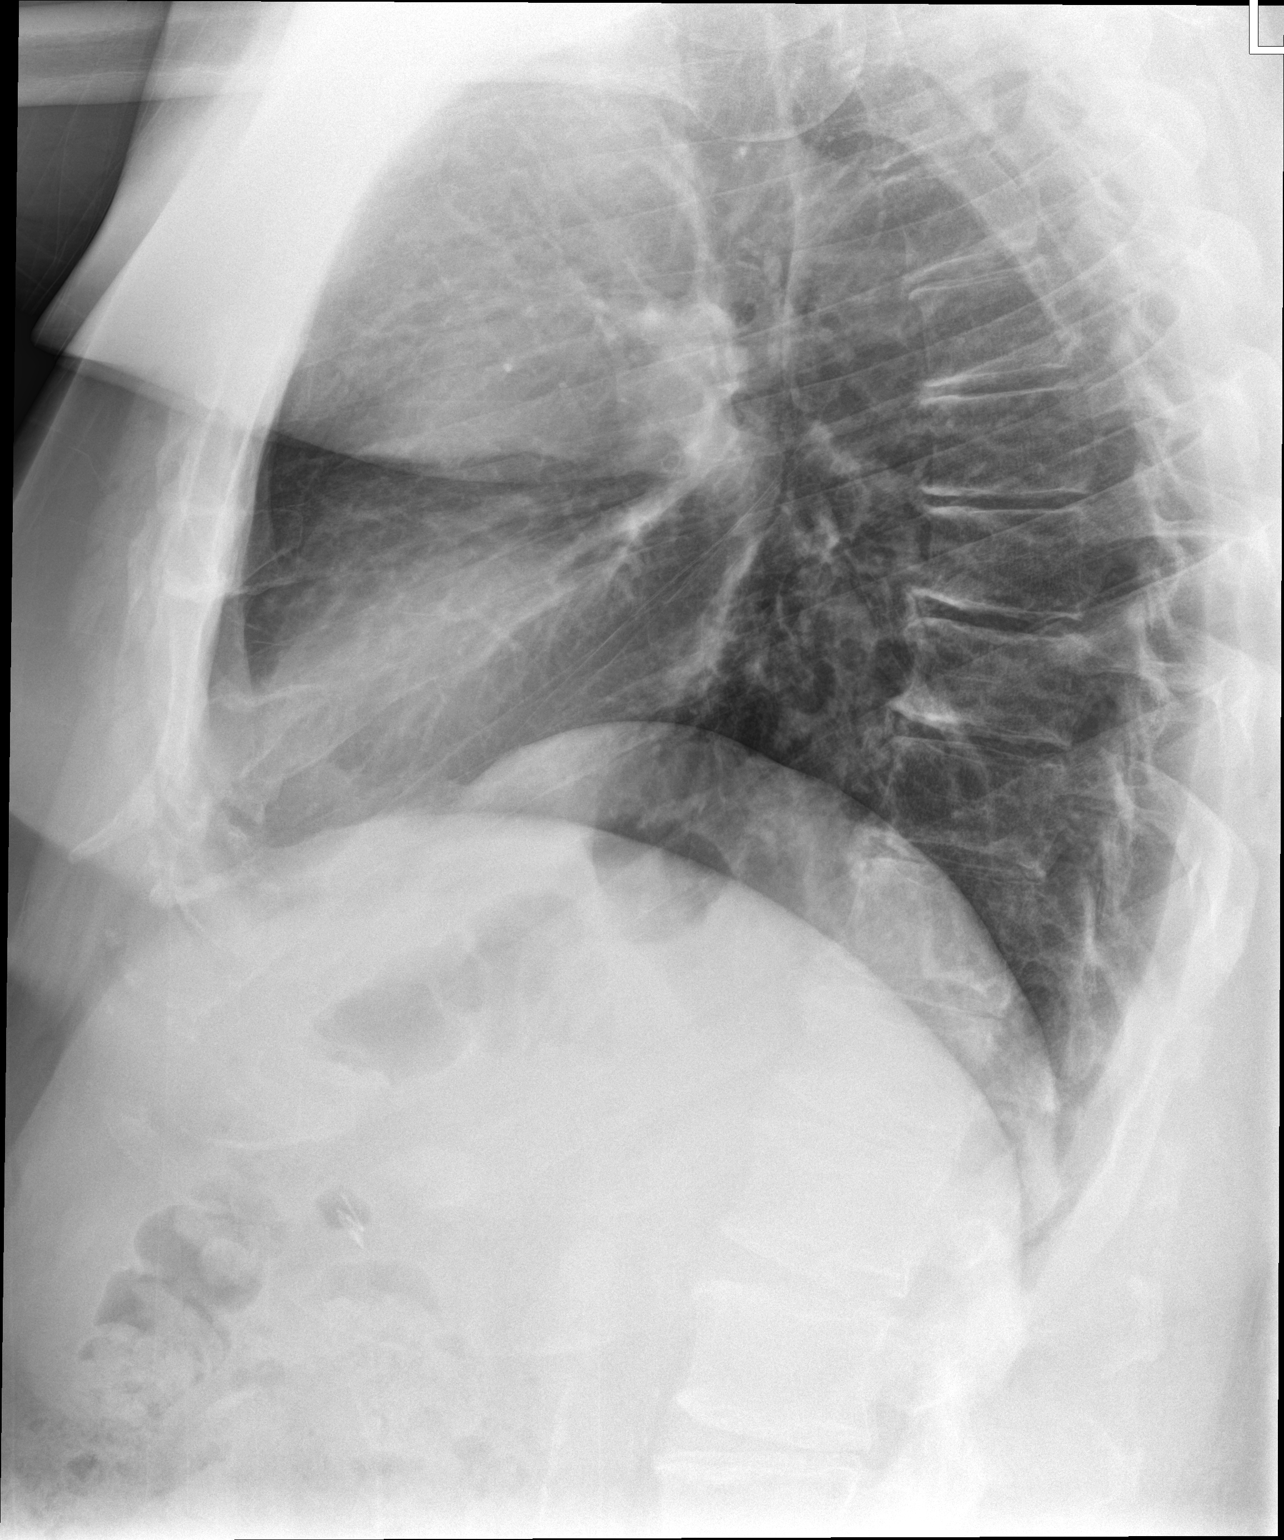
[im 2/2]
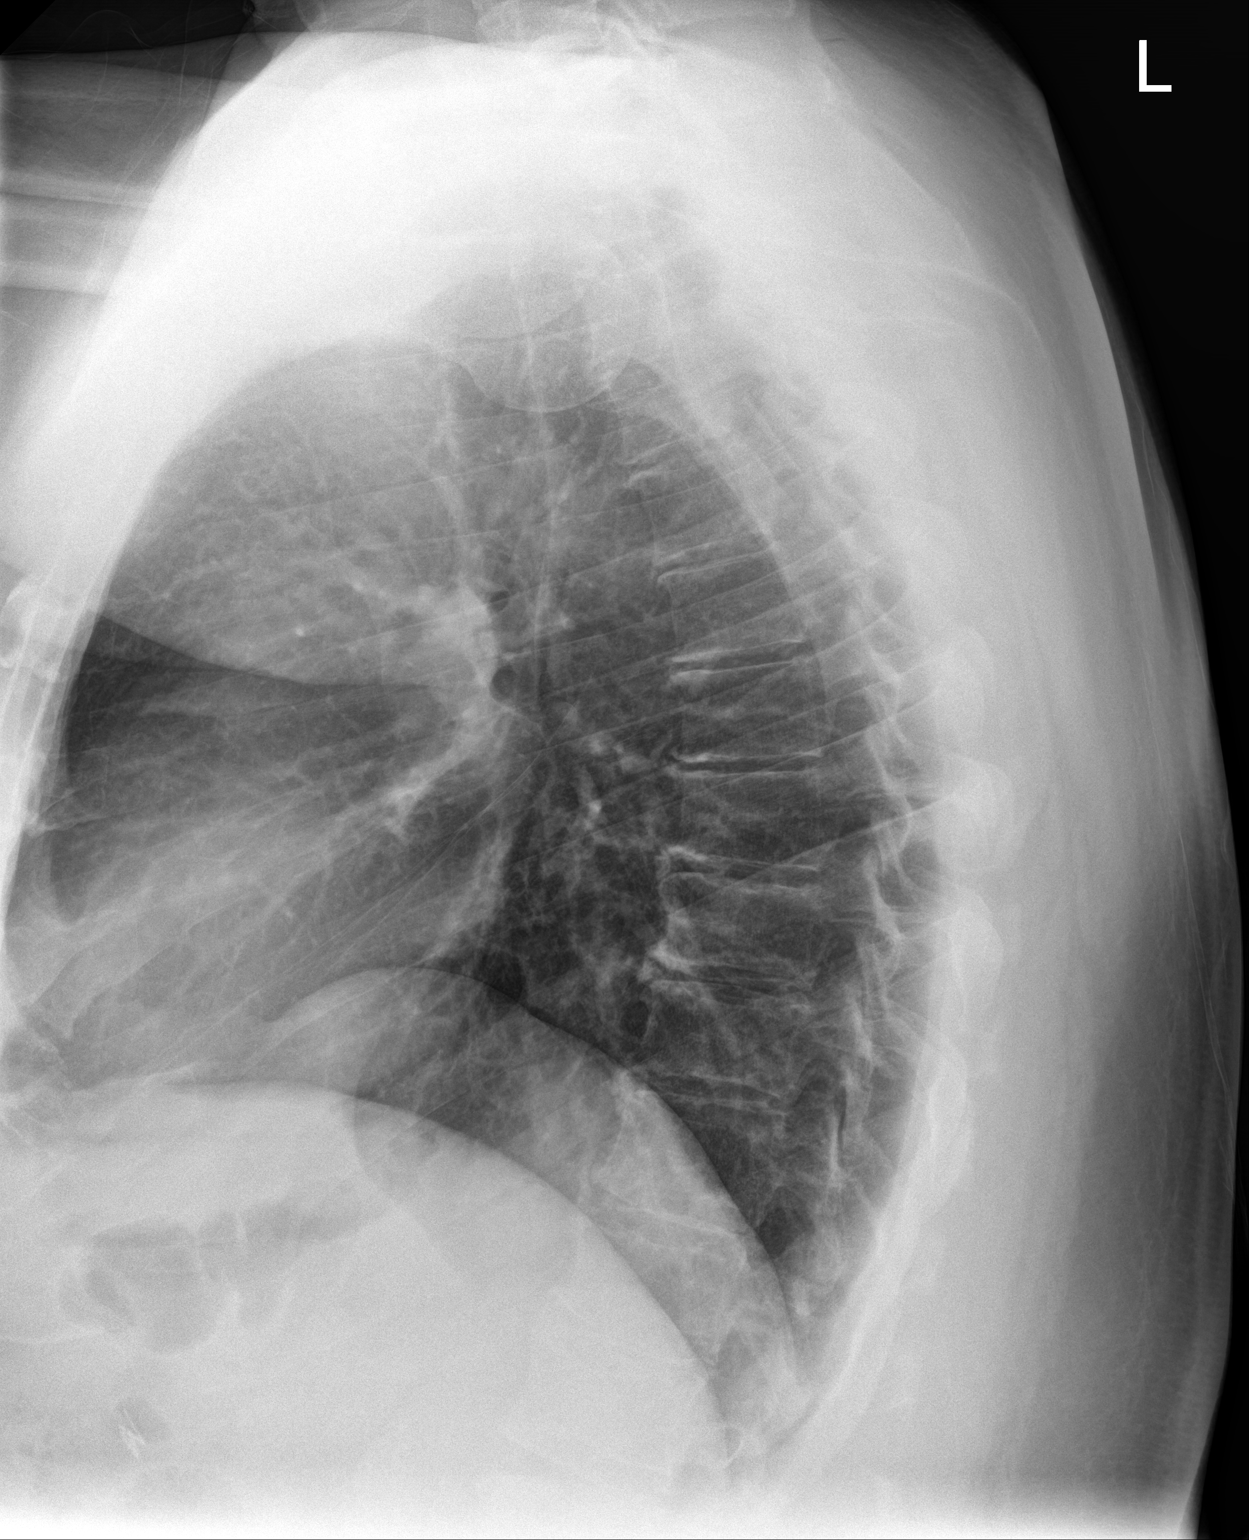

[3 of 3 positions shown; findings below may reference images not displayed]

FINDINGS: The heart size and mediastinal contours are within normal limits. No
focal consolidation. No pleural effusion. No pneumothorax. Thoracic
spondylosis. Cholecystectomy clips.
IMPRESSION: No active cardiopulmonary disease.

## 2023-05-14 ENCOUNTER — Ambulatory Visit
Admission: EM | Admit: 2023-05-14 | Discharge: 2023-05-14 | Disposition: A | Discharge status: Home / Self Care | Payer: BC Managed Care – PPO | Attending: Emergency Medicine | Admitting: Emergency Medicine

## 2023-05-14 DIAGNOSIS — L03012 Cellulitis of left finger: Secondary | ICD-10-CM | POA: Diagnosis not present

## 2023-05-14 MED ORDER — AMOXICILLIN-POT CLAVULANATE 875-125 MG PO TABS: 1.0000 | ORAL_TABLET | Freq: Two times a day (BID) | ORAL | 0 refills | Status: AC

## 2023-05-14 NOTE — ED Provider Notes (Signed)
[facility]    CSN: [id number] Arrival date & time: 05/14/23  1010      History   Chief Complaint Chief Complaint  Patient presents with   Finger Problem    HPI Paul Nielsen is a 52 y.o. male.   HPI  52 year old male with a past medical history significant for CAD presents for evaluation of pain and swelling to the cuticles of his left thumb and middle finger that started 4 days ago.  He states that they are quite sore in the lesion on the middle finger started as what he describes feeling like a hangnail.  He states he does chew the skin around his fingertips.  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  The lesions have been draining a milky pus but patient has not had any fever.  Past Medical History:  Diagnosis Date   Coronary artery disease    2014, 1 stent placed    Patient Active Problem List   Diagnosis Date Noted   Abdominal pain, chronic, epigastric    Gastric erosion    Colon cancer screening    Polyp of colon    History of COVID-19 11/21/2020   Acute non-recurrent maxillary sinusitis 11/21/2020   CAD (coronary artery disease), native coronary artery 02/07/2016   History of tobacco abuse 02/07/2016   Obese 02/07/2016    Past Surgical History:  Procedure Laterality Date   CHOLECYSTECTOMY     COLONOSCOPY WITH PROPOFOL N/A 07/31/2021   Procedure: COLONOSCOPY WITH PROPOFOL;  Surgeon: Toney Reil, MD;  Location: [facility] ENDOSCOPY;  Service: Gastroenterology;  Laterality: N/A;   ESOPHAGOGASTRODUODENOSCOPY (EGD) WITH PROPOFOL N/A 07/31/2021   Procedure: ESOPHAGOGASTRODUODENOSCOPY (EGD) WITH PROPOFOL;  Surgeon: Toney Reil, MD;  Location: [facility] ENDOSCOPY;  Service: Gastroenterology;  Laterality: N/A;       Home Medications    Prior to Admission medications   Medication Sig Start Date End Date Taking? Authorizing Provider  amoxicillin-clavulanate (AUGMENTIN) 875-125 MG  tablet Take 1 tablet by mouth every 12 (twelve) hours for 10 days. 05/14/23 05/24/23 Yes Becky Augusta, NP  ASPIRIN 81 PO Take 1 tablet by mouth daily.   Yes [provider]  atorvastatin (LIPITOR) 40 MG tablet Take 40 mg by mouth daily.   Yes [provider]  EPINEPHrine 0.3 mg/0.3 mL IJ SOAJ injection Inject into the muscle.   Yes [provider]  lisinopril (PRINIVIL,ZESTRIL) 2.5 MG tablet Take 2.5 mg by mouth daily.  01/29/20  [provider]    Family History Family History  Problem Relation Age of Onset   Cancer Mother        breast    Social History Social History   Tobacco Use   Smoking status: Former    Current packs/day: 0.00    Types: Cigarettes    Quit date: 05/02/2013    Years since quitting: 10.0   Smokeless tobacco: Never  Vaping Use   Vaping status: Never Used  Substance Use Topics   Alcohol use: Yes    Comment: socially   Drug use: No     Allergies   Patient has no known allergies.   Review of Systems Review of Systems  Constitutional:  Negative for fever.  Skin:  Positive for color change and wound.     Physical Exam Triage Vital Signs ED Triage Vitals  Encounter Vitals Group     BP 05/14/23 1024 123/84  Systolic BP Percentile --      Diastolic BP Percentile --      Pulse Rate 05/14/23 1024 85     Resp 05/14/23 1024 16     Temp 05/14/23 1024 98.5 F (36.9 C)     Temp Source 05/14/23 1024 Oral     SpO2 05/14/23 1024 95 %     Weight 05/14/23 1023 240 lb (108.9 kg)     Height 05/14/23 1023 6\' 2"  (1.88 m)     Head Circumference --      Peak Flow --      Pain Score 05/14/23 1026 3     Pain Loc --      Pain Education --      Exclude from Growth Chart --    No data found.  Updated Vital Signs BP 123/84 (BP Location: Right Arm)   Pulse 85   Temp 98.5 F (36.9 C) (Oral)   Resp 16   Ht 6\' 2"  (1.88 m)   Wt 240 lb (108.9 kg)   SpO2 95%   BMI 30.81 kg/m   Visual Acuity Right Eye Distance:   Left  Eye Distance:   Bilateral Distance:    Right Eye Near:   Left Eye Near:    Bilateral Near:     Physical Exam Vitals and nursing note reviewed.  Constitutional:      Appearance: Normal appearance.  Skin:    General: Skin is warm and dry.     Capillary Refill: Capillary refill takes less than 2 seconds.     Findings: Erythema present.  Neurological:     General: No focal deficit present.     Mental Status: He is alert and oriented to person, place, and time.      UC Treatments / Results  Labs (all labs ordered are listed, but only abnormal results are displayed) Labs Reviewed - No data to display  EKG   Radiology No results found.  Procedures Procedures (including critical care time)  Medications Ordered in UC Medications - No data to display  Initial Impression / Assessment and Plan / UC Course  I have reviewed the triage vital signs and the nursing notes.  Pertinent labs & imaging results that were available during my care of the patient were reviewed by me and considered in my medical decision making (see chart for details).   Patient is a pleasant, nontoxic-appearing 52 year old gentleman gentleman presenting for evaluation of swelling around the cuticles of his left middle finger and left thumb.    As you can see in images above, the patient has 2 paronychias.  They are both actively draining pus and there is no fluid collection that requires I&D at this time.  I will discharge patient home with a diagnosis of paronychia and start him on Augmentin 875 twice daily for 10 days.  We have also discussed soaking the fingers in warm water and Epsom salt to help facilitate drainage.  Return precautions reviewed.   Final Clinical Impressions(s) / UC Diagnoses   Final diagnoses:  Paronychia of left thumb  Paronychia of left middle finger     Discharge Instructions      Take the Augmentin twice daily with food for 10 days.  Soak your fingers in warm water and Epsom  salts 2-3 times a day to help facilitate drainage.  Keep a dressing on your fingers until the drainage has stopped.  You can use over-the-counter Tylenol and ibuprofen according to the package instructions as needed for  pain.  Return for reevaluation if you develop any increased redness, swelling, drainage, or red streaks going up your finger, or fever.      ED Prescriptions     Medication Sig Dispense Auth. Provider   amoxicillin-clavulanate (AUGMENTIN) 875-125 MG tablet Take 1 tablet by mouth every 12 (twelve) hours for 10 days. 20 tablet Becky Augusta, NP      PDMP not reviewed this encounter.   Becky Augusta, NP 05/14/23 1040

## 2023-05-14 NOTE — Discharge Instructions (Addendum)
Take the Augmentin twice daily with food for 10 days.  Soak your fingers in warm water and Epsom salts 2-3 times a day to help facilitate drainage.  Keep a dressing on your fingers until the drainage has stopped.  You can use over-the-counter Tylenol and ibuprofen according to the package instructions as needed for pain.  Return for reevaluation if you develop any increased redness, swelling, drainage, or red streaks going up your finger, or fever.

## 2023-05-14 NOTE — ED Triage Notes (Signed)
Pt c/o L middle finger/thumb swelling & pain x4 days. Denies any injuries.
